# Patient Record
Sex: Female | Born: 1980 | Race: White | Hispanic: Yes | Marital: Single | Smoking: Never smoker
Health system: Southern US, Community
[De-identification: ages and names within clinical notes are randomized; demographics above are authoritative.]

## PROBLEM LIST (undated history)

## (undated) DIAGNOSIS — O24419 Gestational diabetes mellitus in pregnancy, unspecified control: Secondary | ICD-10-CM

## (undated) DIAGNOSIS — R87619 Unspecified abnormal cytological findings in specimens from cervix uteri: Secondary | ICD-10-CM

## (undated) HISTORY — PX: DILATION AND CURETTAGE OF UTERUS: SHX78

## (undated) HISTORY — DX: Gestational diabetes mellitus in pregnancy, unspecified control: O24.419

---

## 2004-11-04 ENCOUNTER — Ambulatory Visit (HOSPITAL_COMMUNITY): Admission: AD | Admit: 2004-11-04 | Discharge: 2004-11-04 | Payer: Self-pay | Admitting: Obstetrics & Gynecology

## 2005-06-02 ENCOUNTER — Other Ambulatory Visit: Admission: RE | Admit: 2005-06-02 | Discharge: 2005-06-02 | Payer: Self-pay | Admitting: Family Medicine

## 2006-03-25 ENCOUNTER — Inpatient Hospital Stay (HOSPITAL_COMMUNITY): Admission: AD | Admit: 2006-03-25 | Discharge: 2006-03-25 | Payer: Self-pay | Admitting: Obstetrics & Gynecology

## 2006-05-08 ENCOUNTER — Inpatient Hospital Stay (HOSPITAL_COMMUNITY): Admission: AD | Admit: 2006-05-08 | Discharge: 2006-05-08 | Payer: Self-pay | Admitting: Obstetrics and Gynecology

## 2006-05-09 ENCOUNTER — Inpatient Hospital Stay (HOSPITAL_COMMUNITY): Admission: AD | Admit: 2006-05-09 | Discharge: 2006-05-13 | Payer: Self-pay | Admitting: Obstetrics and Gynecology

## 2007-01-11 IMAGING — US US OB COMP LESS 14 WK
1 series · 14 of 28 positions shown · non-contrast
Comparison: none

CLINICAL DATA: EGA by LMP is 21 weeks; nausea and spotting; quantitative BETA HCG was requested but is not available.

[Series 1: us ob comp less 14 wk · 0.29mm/px · 14 of 36 slices shown]
[im 2/36]
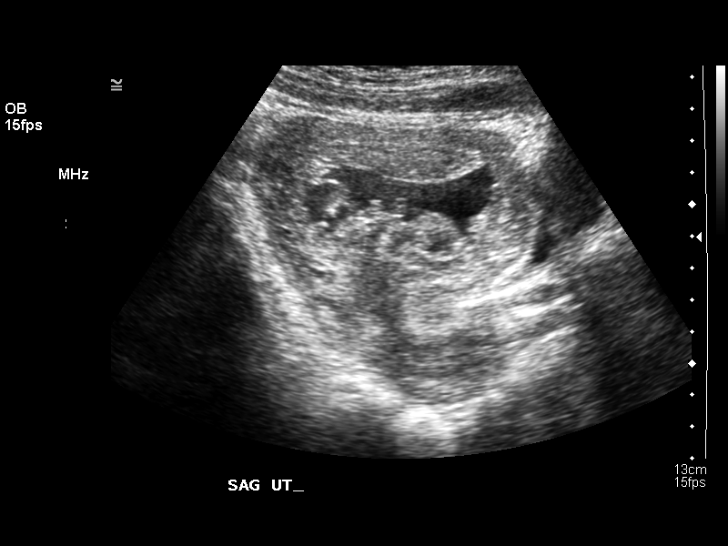
[im 4/36]
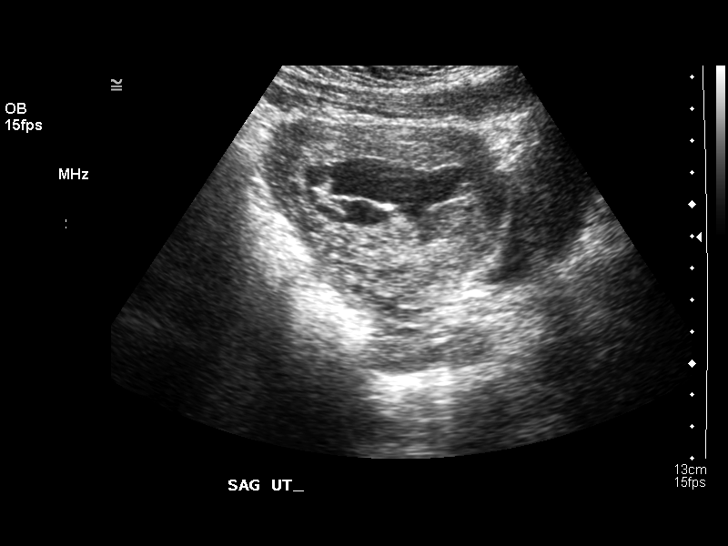
[im 7/36]
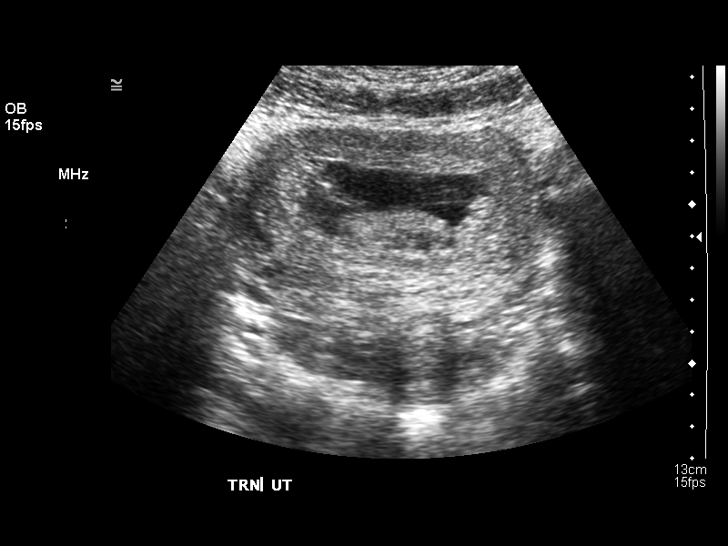
[im 10/36]
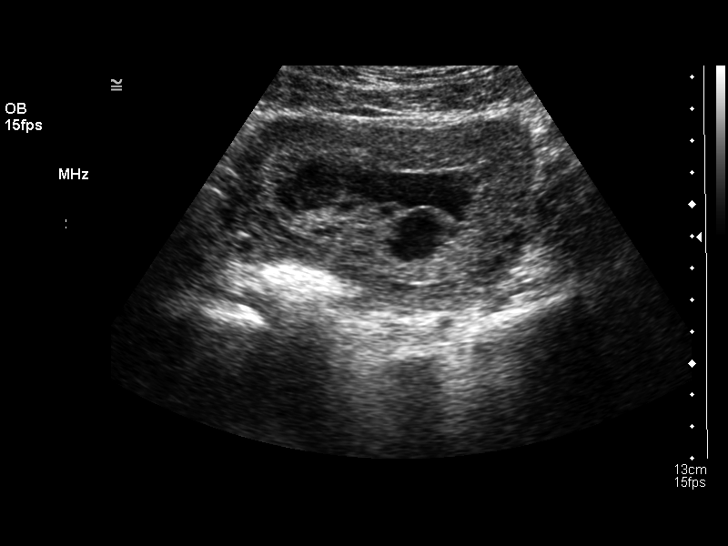
[im 12/36]
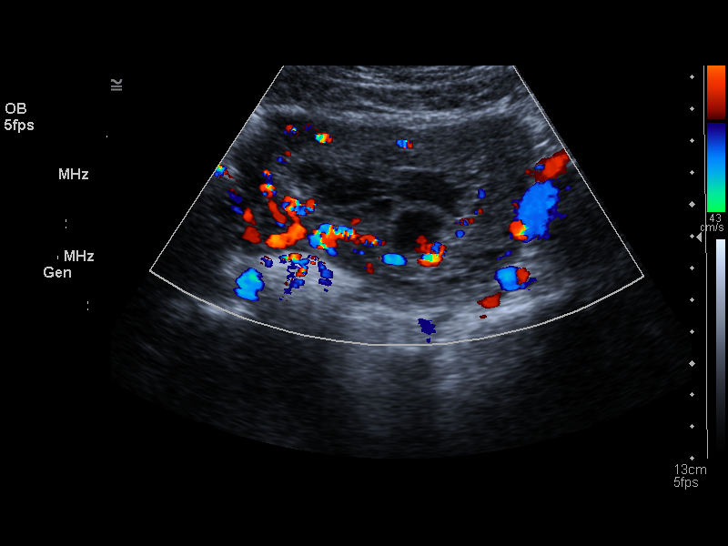
[im 15/36]
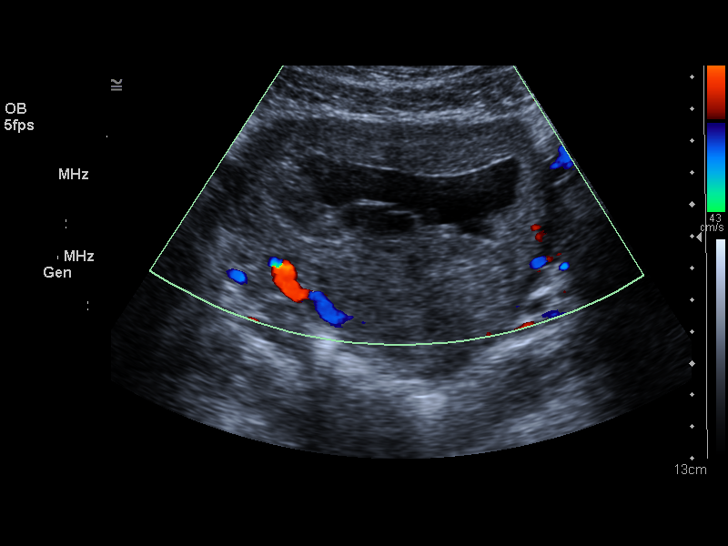
[im 17/36]
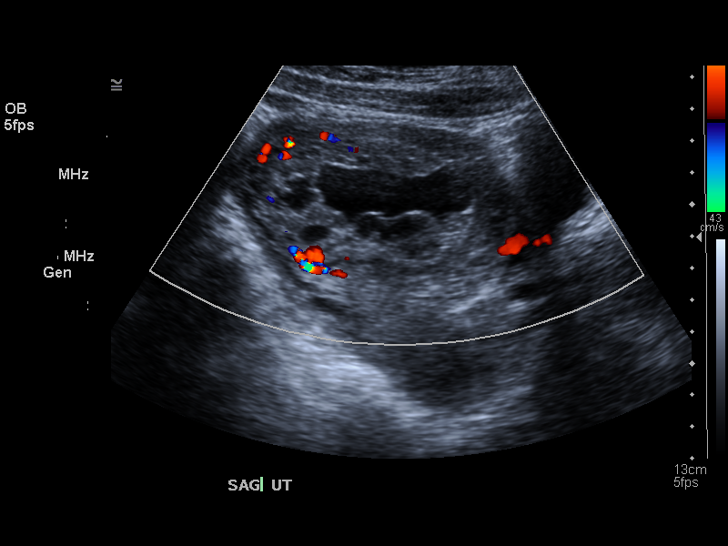
[im 20/36]
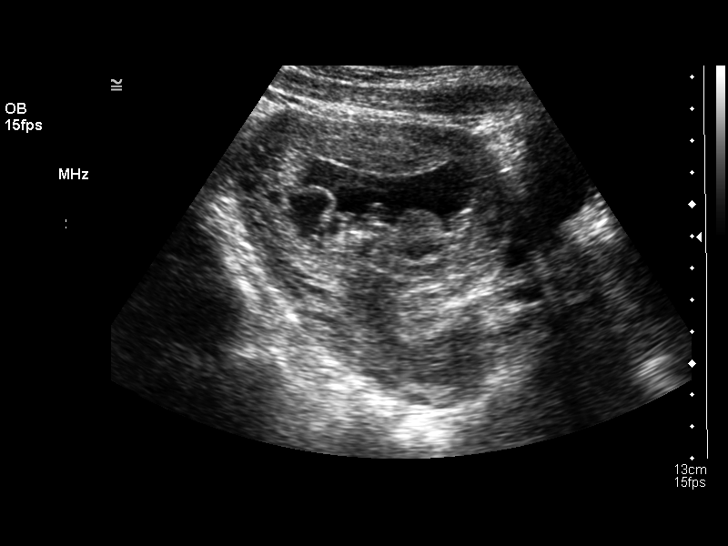
[im 23/36]
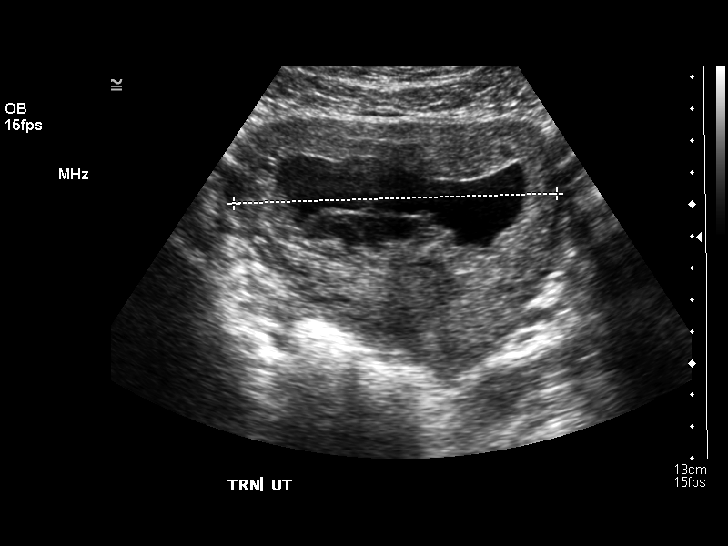
[im 25/36]
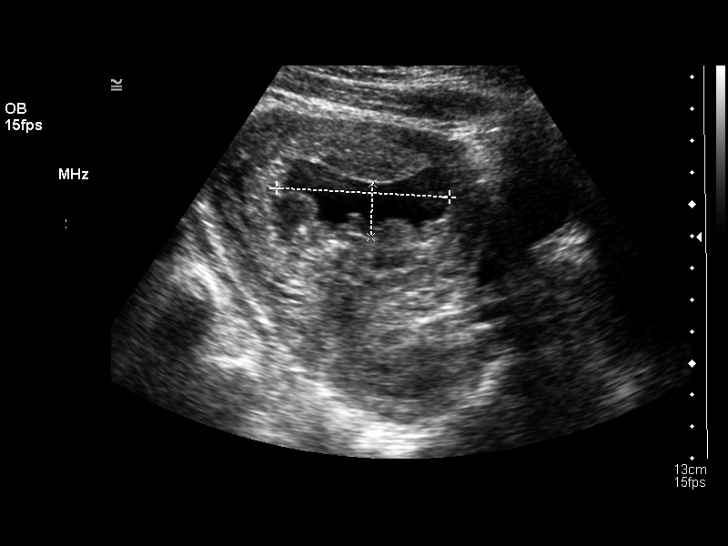
[im 28/36]
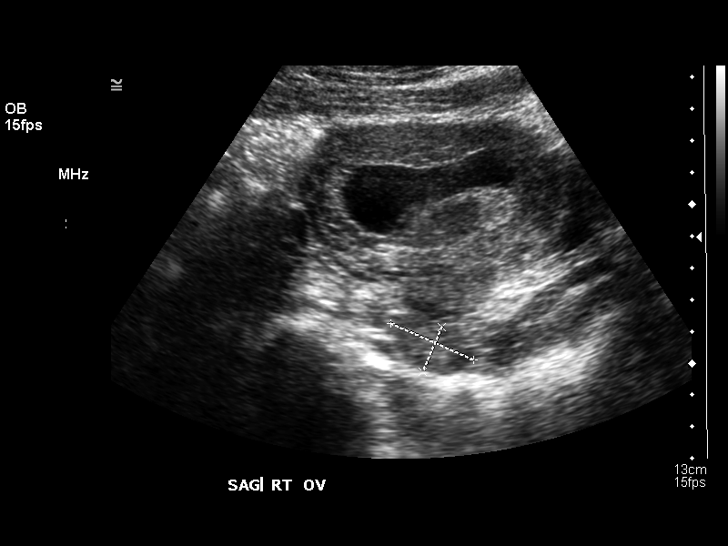
[im 30/36]
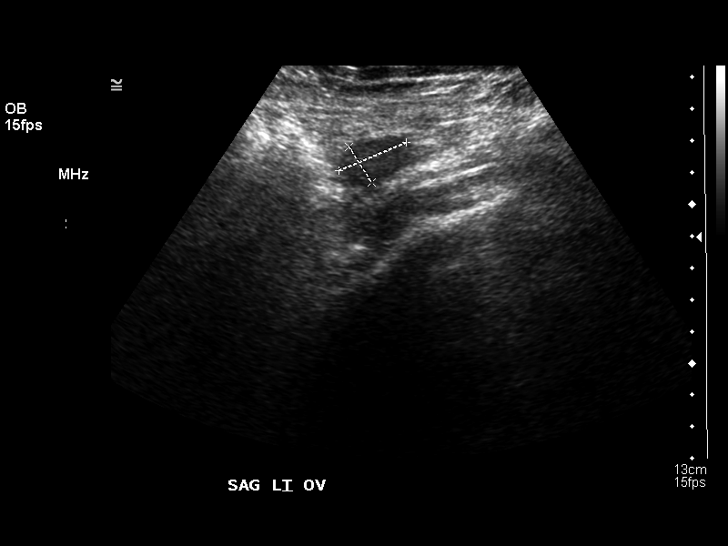
[im 33/36]
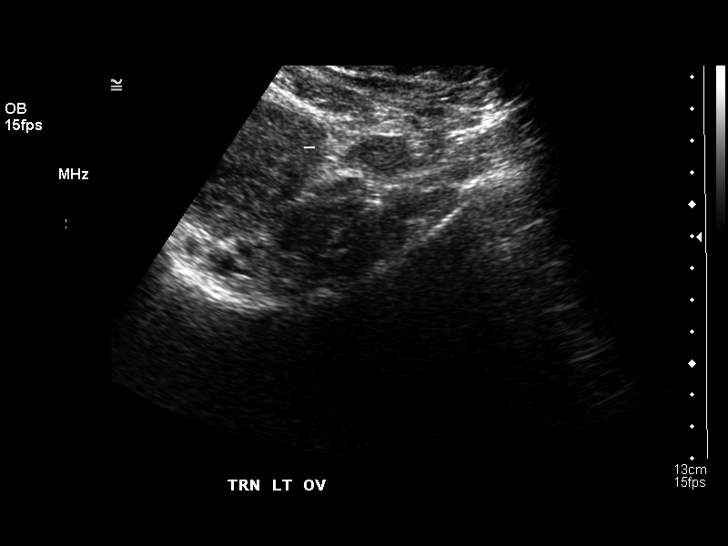
[im 36/36]
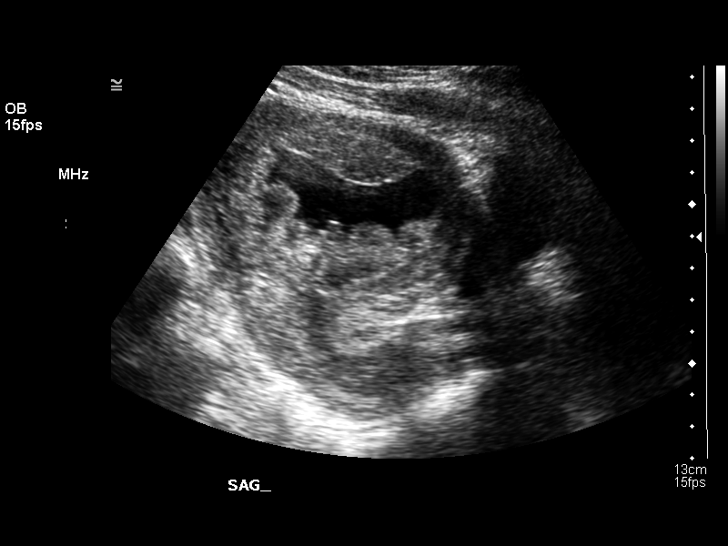

[14 of 28 positions shown; findings below may reference images not displayed]

OBSTETRICAL ULTRASOUND:

Transabdominal and endovaginal images are obtained.  There is abnormal mixed solid and cystic material within the endometrial cavity.   The uterus itself is enlarged measuring 11.4 x 8.7 x 10.2 cm.  There is no obvious fetus within the endometrial cavity.  The intrauterine gestational sac is very irregular and difficult to accurately measure.   Both ovaries have a normal appearance.  The right ovary measures 1.5 x 2.8 x 1.5 cm, and the left ovary measures 2.3 x 1.3 x 2.1 cm.   No adnexal masses or free pelvic fluid are seen.
IMPRESSION: 1.  The mixed solid and cystic material within the uterus most likely represents resorbing fetal demise.   Please correlate with the quantitative BETA HCG values.  

2.  Both adnexa have a normal appearance.

## 2010-05-31 ENCOUNTER — Other Ambulatory Visit: Payer: Self-pay | Admitting: Obstetrics and Gynecology

## 2012-07-31 ENCOUNTER — Other Ambulatory Visit: Payer: Self-pay | Admitting: Obstetrics and Gynecology

## 2014-10-23 LAB — OB RESULTS CONSOLE GC/CHLAMYDIA
CHLAMYDIA, DNA PROBE: NEGATIVE
GC PROBE AMP, GENITAL: NEGATIVE

## 2014-12-04 LAB — OB RESULTS CONSOLE RPR: RPR: NONREACTIVE

## 2014-12-04 LAB — OB RESULTS CONSOLE RUBELLA ANTIBODY, IGM: Rubella: IMMUNE

## 2014-12-04 LAB — OB RESULTS CONSOLE HGB/HCT, BLOOD
HEMATOCRIT: 37 %
HEMOGLOBIN: 12.9 g/dL

## 2014-12-04 LAB — OB RESULTS CONSOLE PLATELET COUNT: Platelets: 222 10*3/uL

## 2014-12-04 LAB — OB RESULTS CONSOLE ANTIBODY SCREEN: ANTIBODY SCREEN: NEGATIVE

## 2014-12-04 LAB — OB RESULTS CONSOLE HIV ANTIBODY (ROUTINE TESTING): HIV: NONREACTIVE

## 2014-12-04 LAB — OB RESULTS CONSOLE VARICELLA ZOSTER ANTIBODY, IGG: Varicella: IMMUNE

## 2014-12-04 LAB — OB RESULTS CONSOLE HEPATITIS B SURFACE ANTIGEN: Hepatitis B Surface Ag: NEGATIVE

## 2014-12-04 LAB — OB RESULTS CONSOLE ABO/RH: RH Type: POSITIVE

## 2015-03-04 LAB — OB RESULTS CONSOLE HGB/HCT, BLOOD
HCT: 31 %
HCT: 31 %
HEMOGLOBIN: 11.2 g/dL
HEMOGLOBIN: 11.2 g/dL

## 2015-03-04 LAB — OB RESULTS CONSOLE RPR
RPR: NONREACTIVE
RPR: NONREACTIVE

## 2015-03-04 LAB — OB RESULTS CONSOLE PLATELET COUNT
PLATELETS: 215 10*3/uL
Platelets: 215 10*3/uL

## 2015-03-23 ENCOUNTER — Encounter: Payer: Self-pay | Admitting: *Deleted

## 2015-03-23 DIAGNOSIS — O0990 Supervision of high risk pregnancy, unspecified, unspecified trimester: Secondary | ICD-10-CM

## 2015-03-23 DIAGNOSIS — O24419 Gestational diabetes mellitus in pregnancy, unspecified control: Secondary | ICD-10-CM | POA: Insufficient documentation

## 2015-04-13 ENCOUNTER — Encounter: Payer: Self-pay | Admitting: Family Medicine

## 2015-04-13 ENCOUNTER — Ambulatory Visit (INDEPENDENT_AMBULATORY_CARE_PROVIDER_SITE_OTHER): Payer: Self-pay | Admitting: Family Medicine

## 2015-04-13 ENCOUNTER — Encounter: Payer: Self-pay | Admitting: *Deleted

## 2015-04-13 ENCOUNTER — Encounter: Payer: Medicaid Other | Attending: Obstetrics and Gynecology | Admitting: *Deleted

## 2015-04-13 VITALS — BP 103/65 | HR 96 | Temp 98.0°F | Ht 59.0 in | Wt 138.0 lb

## 2015-04-13 DIAGNOSIS — Z23 Encounter for immunization: Secondary | ICD-10-CM

## 2015-04-13 DIAGNOSIS — Z713 Dietary counseling and surveillance: Secondary | ICD-10-CM | POA: Insufficient documentation

## 2015-04-13 DIAGNOSIS — Z791 Long term (current) use of non-steroidal anti-inflammatories (NSAID): Secondary | ICD-10-CM | POA: Diagnosis not present

## 2015-04-13 DIAGNOSIS — Z98891 History of uterine scar from previous surgery: Secondary | ICD-10-CM | POA: Insufficient documentation

## 2015-04-13 DIAGNOSIS — O24419 Gestational diabetes mellitus in pregnancy, unspecified control: Secondary | ICD-10-CM | POA: Insufficient documentation

## 2015-04-13 DIAGNOSIS — O0993 Supervision of high risk pregnancy, unspecified, third trimester: Secondary | ICD-10-CM

## 2015-04-13 DIAGNOSIS — O34219 Maternal care for unspecified type scar from previous cesarean delivery: Secondary | ICD-10-CM

## 2015-04-13 MED ORDER — GLUCOSE BLOOD VI STRP: ORAL_STRIP | Status: DC

## 2015-04-13 MED ORDER — ACCU-CHEK FASTCLIX LANCETS MISC: 1.0000 | Freq: Four times a day (QID) | Status: DC

## 2015-04-13 MED ORDER — TETANUS-DIPHTH-ACELL PERTUSSIS 5-2.5-18.5 LF-MCG/0.5 IM SUSP
0.5000 mL | Freq: Once | INTRAMUSCULAR | Status: AC
  Administered 2015-04-13: 0.5 mL via INTRAMUSCULAR

## 2015-04-13 NOTE — Progress Notes (Signed)
Growth U/S with heart imaging 04/23/15 @ 3p with MFM.

## 2015-04-13 NOTE — Patient Instructions (Signed)

## 2015-04-13 NOTE — Addendum Note (Signed)
Addended by: Rosendo GrosHALPIN, Laird Runnion L on: 04/13/2015 01:55 PM   Modules accepted: Orders

## 2015-04-13 NOTE — Addendum Note (Signed)
Addended by: Gerome ApleyZEYFANG, Memorie Yokoyama L on: 04/13/2015 10:09 AM   Modules accepted: Orders

## 2015-04-13 NOTE — Progress Notes (Signed)
Nutrition note: 1st visit consult & GDM diet education Pt was recently diagnosed with GDM. Pt has gained 28# @ 7953w2d, which is slightly > expected. Pt reports eating 3-4x/d. Pt is taking a multivitamin. Pt reports no N&V but has some heartburn. Pt received verbal & written education about GDM diet. Discussed wt gain goals of 25-35# or 1#/wk. Pt agrees to follow GDM diet with 3 meals & 1-3 snacks/d with proper CHO/ protein combination. Pt does not have WIC but plans to apply. Pt is unsure about BF. F/u in 2-4 wks Beverly RevealLaura Foch Rosenwald, MS, RD, LDN, [facility]

## 2015-04-13 NOTE — Addendum Note (Signed)
Addended by: Garret ReddishBARNES, Keni Elison M on: 04/13/2015 04:06 PM   Modules accepted: Orders

## 2015-04-13 NOTE — Assessment & Plan Note (Signed)
VBAC consent signed 04/13/2015  VBAC success ~40%

## 2015-04-13 NOTE — Progress Notes (Signed)
Here for first prenatal visit. [facility] Declines flu shot today; may get it later. Would like tdap today.

## 2015-04-13 NOTE — Progress Notes (Signed)
Subjective:  Beverly Mayer is a 34 y.o. G3P1101 at 3264w2d being seen today for ongoing prenatal care.  She is currently monitored for the following issues for this high-risk pregnancy and has Supervision of high risk pregnancy, antepartum; Gestational diabetes; and Uterine scar from previous cesarean delivery, antepartum on her problem list.  Patient reports no complaints.  Contractions: Not present. Vag. Bleeding: None.  Movement: Present. Denies leaking of fluid.   No checking her sugar currently and has not this whole pregnancy. Made diet changes. Stopped eating sugar.   VBAC success- 40%  Takign advil 2-3 times per week for the last month.   The following portions of the patient's history were reviewed and updated as appropriate: allergies, current medications, past family history, past medical history, past social history, past surgical history and problem list. Problem list updated.  Objective:   Filed Vitals:   04/13/15 0812 04/13/15 0813  BP: 103/65   Pulse: 96   Temp: 98 F (36.7 C)   Height:  4\' 11"  (1.499 m)  Weight: 138 lb (62.596 kg)     Fetal Status: Fetal Heart Rate (bpm): 136   Movement: Present     General:  Alert, oriented and cooperative. Patient is in no acute distress.  Skin: Skin is warm and dry. No rash noted.   Cardiovascular: Normal heart rate noted  Respiratory: Normal respiratory effort, no problems with respiration noted  Abdomen: Soft, gravid, appropriate for gestational age. Pain/Pressure: Present     Pelvic: Vag. Bleeding: None     Cervical exam deferred        Extremities: Normal range of motion.  Edema: None  Mental Status: Normal mood and affect. Normal behavior. Normal judgment and thought content.   Urinalysis:      Assessment and Plan:  Pregnancy: G3P1101 at 6264w2d  1. Supervision of high risk pregnancy, antepartum, third trimester [facility]  2. Gestational diabetes mellitus, unspecified diabetic control, unspecified  trimester Not checking sugars at all. Has made diet changes Meeting with DM ed today Will have patient check sugars QID  3. Uterine scar from previous cesarean delivery, antepartum [place] consent signed US ordered for EFW  Preterm labor symptoms and general obstetric precautions including but not limited to vaginal bleeding, contractions, leaking of fluid and fetal movement were reviewed in detail with the patient. Please refer to After Visit Summary for other counseling recommendations.  Return in about 2 weeks (around 04/27/2015) for Routine prenatal care.   Federico FlakeKimberly Niles Autumn Gunn, MD

## 2015-04-13 NOTE — Progress Notes (Signed)
Did not give urine specimen.

## 2015-04-13 NOTE — Progress Notes (Signed)
  Patient was seen on 04/13/15 for Gestational Diabetes self-management . The following learning objectives were met by the patient :   States the definition of Gestational Diabetes  States when to check blood glucose levels  Demonstrates proper blood glucose monitoring techniques  States the effect of stress and exercise on blood glucose levels  States the importance of limiting caffeine and abstaining from alcohol and smoking  Plan:  Consider  increasing your activity level by walking daily as tolerated Begin checking BG before breakfast and 1-2 hours after first bit of breakfast, lunch and dinner after  as directed by MD  Take medication  as directed by MD  Blood glucose monitor given: Accu-Chek Nano Lot # [id number] Exp: 2016/01/23 Blood glucose reading: FBS 78m/dl  Patient instructed to monitor glucose levels: FBS: 60 - <90 1 hour: <140 2 hour: <120  Patient received the following handouts:  Nutrition Diabetes and Pregnancy  Carbohydrate Counting List  Meal Planning worksheet  Patient will be seen for follow-up as needed.

## 2015-04-23 ENCOUNTER — Other Ambulatory Visit: Payer: Self-pay | Admitting: Family Medicine

## 2015-04-23 ENCOUNTER — Ambulatory Visit (HOSPITAL_COMMUNITY)
Admission: RE | Admit: 2015-04-23 | Discharge: 2015-04-23 | Disposition: A | Discharge status: Home / Self Care | Payer: Medicaid Other | Source: Ambulatory Visit | Attending: Family Medicine | Admitting: Family Medicine

## 2015-04-23 DIAGNOSIS — O24419 Gestational diabetes mellitus in pregnancy, unspecified control: Secondary | ICD-10-CM

## 2015-04-23 DIAGNOSIS — O34219 Maternal care for unspecified type scar from previous cesarean delivery: Secondary | ICD-10-CM | POA: Insufficient documentation

## 2015-04-23 DIAGNOSIS — O24410 Gestational diabetes mellitus in pregnancy, diet controlled: Secondary | ICD-10-CM | POA: Insufficient documentation

## 2015-04-23 DIAGNOSIS — Z3A34 34 weeks gestation of pregnancy: Secondary | ICD-10-CM

## 2015-04-23 DIAGNOSIS — Z36 Encounter for antenatal screening of mother: Secondary | ICD-10-CM | POA: Diagnosis not present

## 2015-04-23 DIAGNOSIS — Z1389 Encounter for screening for other disorder: Secondary | ICD-10-CM

## 2015-04-24 ENCOUNTER — Other Ambulatory Visit: Payer: Self-pay | Admitting: Family Medicine

## 2015-04-24 DIAGNOSIS — Z1389 Encounter for screening for other disorder: Secondary | ICD-10-CM

## 2015-04-24 DIAGNOSIS — O34219 Maternal care for unspecified type scar from previous cesarean delivery: Secondary | ICD-10-CM

## 2015-04-24 DIAGNOSIS — O24419 Gestational diabetes mellitus in pregnancy, unspecified control: Secondary | ICD-10-CM

## 2015-04-24 DIAGNOSIS — Z3A34 34 weeks gestation of pregnancy: Secondary | ICD-10-CM

## 2015-04-27 ENCOUNTER — Encounter: Payer: Medicaid Other | Admitting: Family Medicine

## 2015-05-04 ENCOUNTER — Encounter: Payer: Medicaid Other | Admitting: Obstetrics & Gynecology

## 2015-05-11 ENCOUNTER — Other Ambulatory Visit (HOSPITAL_COMMUNITY)
Admission: RE | Admit: 2015-05-11 | Discharge: 2015-05-11 | Disposition: A | Discharge status: Home / Self Care | Payer: Medicaid Other | Source: Ambulatory Visit | Attending: Obstetrics & Gynecology | Admitting: Obstetrics & Gynecology

## 2015-05-11 ENCOUNTER — Encounter: Payer: Self-pay | Admitting: *Deleted

## 2015-05-11 ENCOUNTER — Ambulatory Visit (INDEPENDENT_AMBULATORY_CARE_PROVIDER_SITE_OTHER): Payer: Medicaid Other | Admitting: Obstetrics & Gynecology

## 2015-05-11 ENCOUNTER — Encounter: Payer: Medicaid Other | Attending: Obstetrics and Gynecology | Admitting: *Deleted

## 2015-05-11 ENCOUNTER — Encounter: Payer: Self-pay | Admitting: Obstetrics & Gynecology

## 2015-05-11 VITALS — BP 111/70 | HR 80 | Temp 98.2°F | Wt 143.0 lb

## 2015-05-11 DIAGNOSIS — Z113 Encounter for screening for infections with a predominantly sexual mode of transmission: Secondary | ICD-10-CM | POA: Diagnosis not present

## 2015-05-11 DIAGNOSIS — Z713 Dietary counseling and surveillance: Secondary | ICD-10-CM | POA: Diagnosis not present

## 2015-05-11 DIAGNOSIS — O24419 Gestational diabetes mellitus in pregnancy, unspecified control: Secondary | ICD-10-CM

## 2015-05-11 LAB — POCT URINALYSIS DIP (DEVICE)
BILIRUBIN URINE: NEGATIVE
GLUCOSE, UA: NEGATIVE mg/dL
Hgb urine dipstick: NEGATIVE
LEUKOCYTES UA: NEGATIVE
NITRITE: NEGATIVE
PH: 6.5 (ref 5.0–8.0)
Protein, ur: NEGATIVE mg/dL
Specific Gravity, Urine: 1.02 (ref 1.005–1.030)
Urobilinogen, UA: 0.2 mg/dL (ref 0.0–1.0)

## 2015-05-11 NOTE — Patient Instructions (Signed)
Cesarean Delivery, Care After  Refer to this sheet in the next few weeks. These instructions provide you with information on caring for yourself after your procedure. Your health care provider may also give you specific instructions. Your treatment has been planned according to current medical practices, but problems sometimes occur. Call your health care provider if you have any problems or questions after you go home.  HOME CARE INSTRUCTIONS   Only take over-the-counter or prescription medications as directed by your health care provider.   Do not drink alcohol, especially if you are breastfeeding or taking medication to relieve pain.   Do not chew or smoke tobacco.   Continue to use good perineal care. Good perineal care includes:    Wiping your perineum from front to back.    Keeping your perineum clean.   Check your surgical cut (incision) daily for increased redness, drainage, swelling, or separation of skin.   Clean your incision gently with soap and water every day, and then pat it dry. If your health care provider says it is okay, leave the incision uncovered. Use a bandage (dressing) if the incision is draining fluid or appears irritated. If the adhesive strips across the incision do not fall off within 7 days, carefully peel them off.   Hug a pillow when coughing or sneezing until your incision is healed. This helps to relieve pain.   Do not use tampons or douche until your health care provider says it is okay.   Shower, wash your hair, and take tub baths as directed by your health care provider.   Wear a well-fitting bra that provides breast support.   Limit wearing support panties or control-top hose.   Drink enough fluids to keep your urine clear or pale yellow.   Eat high-fiber foods such as whole grain cereals and breads, brown rice, beans, and fresh fruits and vegetables every day. These foods may help prevent or relieve constipation.   Resume activities such as climbing stairs,  driving, lifting, exercising, or traveling as directed by your health care provider.   Talk to your health care provider about resuming sexual activities. This is dependent upon your risk of infection, your rate of healing, and your comfort and desire to resume sexual activity.   Try to have someone help you with your household activities and your newborn for at least a few days after you leave the hospital.   Rest as much as possible. Try to rest or take a nap when your newborn is sleeping.   Increase your activities gradually.   Keep all of your scheduled postpartum appointments. It is very important to keep your scheduled follow-up appointments. At these appointments, your health care provider will be checking to make sure that you are healing physically and emotionally.  SEEK MEDICAL CARE IF:    You are passing large clots from your vagina. Save any clots to show your health care provider.   You have a foul smelling discharge from your vagina.   You have trouble urinating.   You are urinating frequently.   You have pain when you urinate.   You have a change in your bowel movements.   You have increasing redness, pain, or swelling near your incision.   You have pus draining from your incision.   Your incision is separating.   You have painful, hard, or reddened breasts.   You have a severe headache.   You have blurred vision or see spots.   You feel sad   or depressed.   You have thoughts of hurting yourself or your newborn.   You have questions about your care, the care of your newborn, or medications.   You are dizzy or light-headed.   You have a rash.   You have pain, redness, or swelling at the site of the removed intravenous access (IV) tube.   You have nausea or vomiting.   You stopped breastfeeding and have not had a menstrual period within 12 weeks of stopping.   You are not breastfeeding and have not had a menstrual period within 12 weeks of delivery.   You have a fever.  SEEK  IMMEDIATE MEDICAL CARE IF:   You have persistent pain.   You have chest pain.   You have shortness of breath.   You faint.   You have leg pain.   You have stomach pain.   Your vaginal bleeding saturates 2 or more sanitary pads in 1 hour.  MAKE SURE YOU:    Understand these instructions.   Will watch your condition.   Will get help right away if you are not doing well or get worse.     This information is not intended to replace advice given to you by your health care provider. Make sure you discuss any questions you have with your health care provider.     Document Released: 01/01/2002 Document Revised: 05/02/2014 Document Reviewed: 12/07/2011  Elsevier Interactive Patient Education 2016 [employer]

## 2015-05-11 NOTE — Progress Notes (Signed)
36 wk cultures today Pt reports pressure in vaginal area

## 2015-05-11 NOTE — Progress Notes (Signed)
Patient presents stating that her [facility] stated that her Medicaid would not cover her test strips. She made no effort to contact the clinic for resolution. Therefore she has not tested at all this month. I contacted [facility] and provided new RX for different testing supplies. I stressed the importance of testing regularly and managing her glucose effectively for the welfare of her and the baby.  Dispensed: Accu-Check [employer] Connect Lot: [id number] Exp 09/23/15 Glucose WNL

## 2015-05-11 NOTE — Progress Notes (Signed)
Subjective:hasn't had test strips for 3 weeks  Beverly Mayer is a 35 y.o. G3P1101 at 8726w2d being seen today for ongoing prenatal care.  She is currently monitored for the following issues for this high-risk pregnancy and has Supervision of high risk pregnancy, antepartum; Gestational diabetes; Uterine scar from previous cesarean delivery, antepartum; and NSAID in pregnancy, third trimester on her problem list.  Patient reports no complaints.  Contractions: Not present. Vag. Bleeding: None.  Movement: Present. Denies leaking of fluid.   The following portions of the patient's history were reviewed and updated as appropriate: allergies, current medications, past family history, past medical history, past social history, past surgical history and problem list. Problem list updated.  Objective:   Filed Vitals:   05/11/15 1106  BP: 111/70  Pulse: 80  Temp: 98.2 F (36.8 C)  Weight: 143 lb (64.864 kg)    Fetal Status:     Movement: Present     General:  Alert, oriented and cooperative. Patient is in no acute distress.  Skin: Skin is warm and dry. No rash noted.   Cardiovascular: Normal heart rate noted  Respiratory: Normal respiratory effort, no problems with respiration noted  Abdomen: Soft, gravid, appropriate for gestational age. Pain/Pressure: Present     Pelvic: Vag. Bleeding: None     Cervical exam deferred        Extremities: Normal range of motion.  Edema: None  Mental Status: Normal mood and affect. Normal behavior. Normal judgment and thought content.   Urinalysis:      Assessment and Plan:  Pregnancy: G3P1101 at 6526w2d  1. Gestational diabetes mellitus in third trimester, unspecified diabetic control No testing to review - Culture, beta strep (group b only) - [facility]Chlamydia [facility]  Term labor symptoms and general obstetric precautions including but not limited to vaginal bleeding, contractions, leaking of fluid and fetal movement were  reviewed in detail with the patient. Please refer to After Visit Summary for other counseling recommendations.  Return in about 1 week (around 05/18/2015). Repeat CS and BTL requested at 39 weeks 05/23/15  Adam PhenixJames G Dionisia Pacholski, MD

## 2015-05-12 ENCOUNTER — Encounter (HOSPITAL_COMMUNITY): Payer: Self-pay | Admitting: *Deleted

## 2015-05-12 LAB — GC/CHLAMYDIA PROBE AMP (~~LOC~~) NOT AT ARMC
Chlamydia: NEGATIVE
Neisseria Gonorrhea: NEGATIVE

## 2015-05-13 LAB — CULTURE, BETA STREP (GROUP B ONLY)

## 2015-05-14 ENCOUNTER — Encounter (HOSPITAL_COMMUNITY): Payer: Self-pay | Admitting: Obstetrics & Gynecology

## 2015-05-14 ENCOUNTER — Encounter: Payer: Self-pay | Admitting: *Deleted

## 2015-05-18 ENCOUNTER — Ambulatory Visit (INDEPENDENT_AMBULATORY_CARE_PROVIDER_SITE_OTHER): Payer: Medicaid Other | Admitting: Family Medicine

## 2015-05-18 VITALS — BP 114/77 | HR 96 | Temp 98.3°F | Wt 149.0 lb

## 2015-05-18 DIAGNOSIS — O24419 Gestational diabetes mellitus in pregnancy, unspecified control: Secondary | ICD-10-CM

## 2015-05-18 DIAGNOSIS — O34219 Maternal care for unspecified type scar from previous cesarean delivery: Secondary | ICD-10-CM

## 2015-05-18 DIAGNOSIS — O0993 Supervision of high risk pregnancy, unspecified, third trimester: Secondary | ICD-10-CM

## 2015-05-18 NOTE — Progress Notes (Signed)
Reviewed tip of week with patient  

## 2015-05-18 NOTE — Patient Instructions (Signed)
Contraception Choices Contraception (birth control) is the use of any methods or devices to prevent pregnancy. Below are some methods to help avoid pregnancy. HORMONAL METHODS   Contraceptive implant. This is a thin, plastic tube containing progesterone hormone. It does not contain estrogen hormone. Your health care provider inserts the tube in the inner part of the upper arm. The tube can remain in place for up to 3 years. After 3 years, the implant must be removed. The implant prevents the ovaries from releasing an egg (ovulation), thickens the cervical mucus to prevent sperm from entering the uterus, and thins the lining of the inside of the uterus.  Progesterone-only injections. These injections are given every 3 months by your health care provider to prevent pregnancy. This synthetic progesterone hormone stops the ovaries from releasing eggs. It also thickens cervical mucus and changes the uterine lining. This makes it harder for sperm to survive in the uterus.  Birth control pills. These pills contain estrogen and progesterone hormone. They work by preventing the ovaries from releasing eggs (ovulation). They also cause the cervical mucus to thicken, preventing the sperm from entering the uterus. Birth control pills are prescribed by a health care provider.Birth control pills can also be used to treat heavy periods.  Minipill. This type of birth control pill contains only the progesterone hormone. They are taken every day of each month and must be prescribed by your health care provider.  Birth control patch. The patch contains hormones similar to those in birth control pills. It must be changed once a week and is prescribed by a health care provider.  Vaginal ring. The ring contains hormones similar to those in birth control pills. It is left in the vagina for 3 weeks, removed for 1 week, and then a new one is put back in place. The patient must be comfortable inserting and removing the ring  from the vagina.A health care provider's prescription is necessary.  Emergency contraception. Emergency contraceptives prevent pregnancy after unprotected sexual intercourse. This pill can be taken right after sex or up to 5 days after unprotected sex. It is most effective the sooner you take the pills after having sexual intercourse. Most emergency contraceptive pills are available without a prescription. Check with your pharmacist. Do not use emergency contraception as your only form of birth control. BARRIER METHODS   Female condom. This is a thin sheath (latex or rubber) that is worn over the penis during sexual intercourse. It can be used with spermicide to increase effectiveness.  Female condom. This is a soft, loose-fitting sheath that is put into the vagina before sexual intercourse.  Diaphragm. This is a soft, latex, dome-shaped barrier that must be fitted by a health care provider. It is inserted into the vagina, along with a spermicidal jelly. It is inserted before intercourse. The diaphragm should be left in the vagina for 6 to 8 hours after intercourse.  Cervical cap. This is a round, soft, latex or plastic cup that fits over the cervix and must be fitted by a health care provider. The cap can be left in place for up to 48 hours after intercourse.  Sponge. This is a soft, circular piece of polyurethane foam. The sponge has spermicide in it. It is inserted into the vagina after wetting it and before sexual intercourse.  Spermicides. These are chemicals that kill or block sperm from entering the cervix and uterus. They come in the form of creams, jellies, suppositories, foam, or tablets. They do not require a   prescription. They are inserted into the vagina with an applicator before having sexual intercourse. The process must be repeated every time you have sexual intercourse. INTRAUTERINE CONTRACEPTION  Intrauterine device (IUD). This is a T-shaped device that is put in a woman's uterus  during a menstrual period to prevent pregnancy. There are 2 types:  Copper IUD. This type of IUD is wrapped in copper wire and is placed inside the uterus. Copper makes the uterus and fallopian tubes produce a fluid that kills sperm. It can stay in place for 10 years.  Hormone IUD. This type of IUD contains the hormone progestin (synthetic progesterone). The hormone thickens the cervical mucus and prevents sperm from entering the uterus, and it also thins the uterine lining to prevent implantation of a fertilized egg. The hormone can weaken or kill the sperm that get into the uterus. It can stay in place for 3-5 years, depending on which type of IUD is used. PERMANENT METHODS OF CONTRACEPTION  Female tubal ligation. This is when the woman's fallopian tubes are surgically sealed, tied, or blocked to prevent the egg from traveling to the uterus.  Hysteroscopic sterilization. This involves placing a small coil or insert into each fallopian tube. Your doctor uses a technique called hysteroscopy to do the procedure. The device causes scar tissue to form. This results in permanent blockage of the fallopian tubes, so the sperm cannot fertilize the egg. It takes about 3 months after the procedure for the tubes to become blocked. You must use another form of birth control for these 3 months.  Female sterilization. This is when the female has the tubes that carry sperm tied off (vasectomy).This blocks sperm from entering the vagina during sexual intercourse. After the procedure, the man can still ejaculate fluid (semen). NATURAL PLANNING METHODS  Natural family planning. This is not having sexual intercourse or using a barrier method (condom, diaphragm, cervical cap) on days the woman could become pregnant.  Calendar method. This is keeping track of the length of each menstrual cycle and identifying when you are fertile.  Ovulation method. This is avoiding sexual intercourse during ovulation.  Symptothermal  method. This is avoiding sexual intercourse during ovulation, using a thermometer and ovulation symptoms.  Post-ovulation method. This is timing sexual intercourse after you have ovulated. Regardless of which type or method of contraception you choose, it is important that you use condoms to protect against the transmission of sexually transmitted infections (STIs). Talk with your health care provider about which form of contraception is most appropriate for you.   This information is not intended to replace advice given to you by your health care provider. Make sure you discuss any questions you have with your health care provider.   Document Released: 04/11/2005 Document Revised: 04/16/2013 Document Reviewed: 10/04/2012 Elsevier Interactive Patient Education 2016 [employer]  Breastfeeding Deciding to breastfeed is one of the best choices you can make for you and your baby. A change in hormones during pregnancy causes your breast tissue to grow and increases the number and size of your milk ducts. These hormones also allow proteins, sugars, and fats from your blood supply to make breast milk in your milk-producing glands. Hormones prevent breast milk from being released before your baby is born as well as prompt milk flow after birth. Once breastfeeding has begun, thoughts of your baby, as well as his or her sucking or crying, can stimulate the release of milk from your milk-producing glands.  BENEFITS OF BREASTFEEDING For Your Baby    Your first milk (colostrum) helps your baby's digestive system function better.  There are antibodies in your milk that help your baby fight off infections.  Your baby has a lower incidence of asthma, allergies, and sudden infant death syndrome.  The nutrients in breast milk are better for your baby than infant formulas and are designed uniquely for your baby's needs.  Breast milk improves your baby's brain development.  Your baby is less likely to develop  other conditions, such as childhood obesity, asthma, or type 2 diabetes mellitus. For You  Breastfeeding helps to create a very special bond between you and your baby.  Breastfeeding is convenient. Breast milk is always available at the correct temperature and costs nothing.  Breastfeeding helps to burn calories and helps you lose the weight gained during pregnancy.  Breastfeeding makes your uterus contract to its prepregnancy size faster and slows bleeding (lochia) after you give birth.   Breastfeeding helps to lower your risk of developing type 2 diabetes mellitus, osteoporosis, and breast or ovarian cancer later in life. SIGNS THAT YOUR BABY IS HUNGRY Early Signs of Hunger  Increased alertness or activity.  Stretching.  Movement of the head from side to side.  Movement of the head and opening of the mouth when the corner of the mouth or cheek is stroked (rooting).  Increased sucking sounds, smacking lips, cooing, sighing, or squeaking.  Hand-to-mouth movements.  Increased sucking of fingers or hands. Late Signs of Hunger  Fussing.  Intermittent crying. Extreme Signs of Hunger Signs of extreme hunger will require calming and consoling before your baby will be able to breastfeed successfully. Do not wait for the following signs of extreme hunger to occur before you initiate breastfeeding:  Restlessness.  A loud, strong cry.  Screaming. BREASTFEEDING BASICS Breastfeeding Initiation  Find a comfortable place to sit or lie down, with your neck and back well supported.  Place a pillow or rolled up blanket under your baby to bring him or her to the level of your breast (if you are seated). Nursing pillows are specially designed to help support your arms and your baby while you breastfeed.  Make sure that your baby's abdomen is facing your abdomen.  Gently massage your breast. With your fingertips, massage from your chest wall toward your nipple in a circular motion.  This encourages milk flow. You may need to continue this action during the feeding if your milk flows slowly.  Support your breast with 4 fingers underneath and your thumb above your nipple. Make sure your fingers are well away from your nipple and your baby's mouth.  Stroke your baby's lips gently with your finger or nipple.  When your baby's mouth is open wide enough, quickly bring your baby to your breast, placing your entire nipple and as much of the colored area around your nipple (areola) as possible into your baby's mouth.  More areola should be visible above your baby's upper lip than below the lower lip.  Your baby's tongue should be between his or her lower gum and your breast.  Ensure that your baby's mouth is correctly positioned around your nipple (latched). Your baby's lips should create a seal on your breast and be turned out (everted).  It is common for your baby to suck about 2-3 minutes in order to start the flow of breast milk. Latching Teaching your baby how to latch on to your breast properly is very important. An improper latch can cause nipple pain and decreased milk supply for   you and poor weight gain in your baby. Also, if your baby is not latched onto your nipple properly, he or she may swallow some air during feeding. This can make your baby fussy. Burping your baby when you switch breasts during the feeding can help to get rid of the air. However, teaching your baby to latch on properly is still the best way to prevent fussiness from swallowing air while breastfeeding. Signs that your baby has successfully latched on to your nipple:  Silent tugging or silent sucking, without causing you pain.  Swallowing heard between every 3-4 sucks.  Muscle movement above and in front of his or her ears while sucking. Signs that your baby has not successfully latched on to nipple:  Sucking sounds or smacking sounds from your baby while breastfeeding.  Nipple pain. If you  think your baby has not latched on correctly, slip your finger into the corner of your baby's mouth to break the suction and place it between your baby's gums. Attempt breastfeeding initiation again. Signs of Successful Breastfeeding Signs from your baby:  A gradual decrease in the number of sucks or complete cessation of sucking.  Falling asleep.  Relaxation of his or her body.  Retention of a small amount of milk in his or her mouth.  Letting go of your breast by himself or herself. Signs from you:  Breasts that have increased in firmness, weight, and size 1-3 hours after feeding.  Breasts that are softer immediately after breastfeeding.  Increased milk volume, as well as a change in milk consistency and color by the fifth day of breastfeeding.  Nipples that are not sore, cracked, or bleeding. Signs That Your Baby is Getting Enough Milk  Wetting at least 3 diapers in a 24-hour period. The urine should be clear and pale yellow by age 5 days.  At least 3 stools in a 24-hour period by age 5 days. The stool should be soft and yellow.  At least 3 stools in a 24-hour period by age 7 days. The stool should be seedy and yellow.  No loss of weight greater than 10% of birth weight during the first 3 days of age.  Average weight gain of 4-7 ounces (113-198 g) per week after age 4 days.  Consistent daily weight gain by age 5 days, without weight loss after the age of 2 weeks. After a feeding, your baby may spit up a small amount. This is common. BREASTFEEDING FREQUENCY AND DURATION Frequent feeding will help you make more milk and can prevent sore nipples and breast engorgement. Breastfeed when you feel the need to reduce the fullness of your breasts or when your baby shows signs of hunger. This is called "breastfeeding on demand." Avoid introducing a pacifier to your baby while you are working to establish breastfeeding (the first 4-6 weeks after your baby is born). After this time you  may choose to use a pacifier. Research has shown that pacifier use during the first year of a baby's life decreases the risk of sudden infant death syndrome (SIDS). Allow your baby to feed on each breast as long as he or she wants. Breastfeed until your baby is finished feeding. When your baby unlatches or falls asleep while feeding from the first breast, offer the second breast. Because newborns are often sleepy in the first few weeks of life, you may need to awaken your baby to get him or her to feed. Breastfeeding times will vary from baby to baby. However, the following   rules can serve as a guide to help you ensure that your baby is properly fed:  Newborns (babies 4 weeks of age or younger) may breastfeed every 1-3 hours.  Newborns should not go longer than 3 hours during the day or 5 hours during the night without breastfeeding.  You should breastfeed your baby a minimum of 8 times in a 24-hour period until you begin to introduce solid foods to your baby at around 6 months of age. BREAST MILK PUMPING Pumping and storing breast milk allows you to ensure that your baby is exclusively fed your breast milk, even at times when you are unable to breastfeed. This is especially important if you are going back to work while you are still breastfeeding or when you are not able to be present during feedings. Your lactation consultant can give you guidelines on how long it is safe to store breast milk. A breast pump is a machine that allows you to pump milk from your breast into a sterile bottle. The pumped breast milk can then be stored in a refrigerator or freezer. Some breast pumps are operated by hand, while others use electricity. Ask your lactation consultant which type will work best for you. Breast pumps can be purchased, but some hospitals and breastfeeding support groups lease breast pumps on a monthly basis. A lactation consultant can teach you how to hand express breast milk, if you prefer not to use  a pump. CARING FOR YOUR BREASTS WHILE YOU BREASTFEED Nipples can become dry, cracked, and sore while breastfeeding. The following recommendations can help keep your breasts moisturized and healthy:  Avoid using soap on your nipples.  Wear a supportive bra. Although not required, special nursing bras and tank tops are designed to allow access to your breasts for breastfeeding without taking off your entire bra or top. Avoid wearing underwire-style bras or extremely tight bras.  Air dry your nipples for 3-4minutes after each feeding.  Use only cotton bra pads to absorb leaked breast milk. Leaking of breast milk between feedings is normal.  Use lanolin on your nipples after breastfeeding. Lanolin helps to maintain your skin's normal moisture barrier. If you use pure lanolin, you do not need to wash it off before feeding your baby again. Pure lanolin is not toxic to your baby. You may also hand express a few drops of breast milk and gently massage that milk into your nipples and allow the milk to air dry. In the first few weeks after giving birth, some women experience extremely full breasts (engorgement). Engorgement can make your breasts feel heavy, warm, and tender to the touch. Engorgement peaks within 3-5 days after you give birth. The following recommendations can help ease engorgement:  Completely empty your breasts while breastfeeding or pumping. You may want to start by applying warm, moist heat (in the shower or with warm water-soaked hand towels) just before feeding or pumping. This increases circulation and helps the milk flow. If your baby does not completely empty your breasts while breastfeeding, pump any extra milk after he or she is finished.  Wear a snug bra (nursing or regular) or tank top for 1-2 days to signal your body to slightly decrease milk production.  Apply ice packs to your breasts, unless this is too uncomfortable for you.  Make sure that your baby is latched on and  positioned properly while breastfeeding. If engorgement persists after 48 hours of following these recommendations, contact your health care provider or a lactation consultant. OVERALL HEALTH   CARE RECOMMENDATIONS WHILE BREASTFEEDING  Eat healthy foods. Alternate between meals and snacks, eating 3 of each per day. Because what you eat affects your breast milk, some of the foods may make your baby more irritable than usual. Avoid eating these foods if you are sure that they are negatively affecting your baby.  Drink milk, fruit juice, and water to satisfy your thirst (about 10 glasses a day).  Rest often, relax, and continue to take your prenatal vitamins to prevent fatigue, stress, and anemia.  Continue breast self-awareness checks.  Avoid chewing and smoking tobacco. Chemicals from cigarettes that pass into breast milk and exposure to secondhand smoke may harm your baby.  Avoid alcohol and drug use, including marijuana. Some medicines that may be harmful to your baby can pass through breast milk. It is important to ask your health care provider before taking any medicine, including all over-the-counter and prescription medicine as well as vitamin and herbal supplements. It is possible to become pregnant while breastfeeding. If birth control is desired, ask your health care provider about options that will be safe for your baby. SEEK MEDICAL CARE IF:  You feel like you want to stop breastfeeding or have become frustrated with breastfeeding.  You have painful breasts or nipples.  Your nipples are cracked or bleeding.  Your breasts are red, tender, or warm.  You have a swollen area on either breast.  You have a fever or chills.  You have nausea or vomiting.  You have drainage other than breast milk from your nipples.  Your breasts do not become full before feedings by the fifth day after you give birth.  You feel sad and depressed.  Your baby is too sleepy to eat well.  Your  baby is having trouble sleeping.   Your baby is wetting less than 3 diapers in a 24-hour period.  Your baby has less than 3 stools in a 24-hour period.  Your baby's skin or the white part of his or her eyes becomes yellow.   Your baby is not gaining weight by 5 days of age. SEEK IMMEDIATE MEDICAL CARE IF:  Your baby is overly tired (lethargic) and does not want to wake up and feed.  Your baby develops an unexplained fever.   This information is not intended to replace advice given to you by your health care provider. Make sure you discuss any questions you have with your health care provider.   Document Released: 04/11/2005 Document Revised: 12/31/2014 Document Reviewed: 10/03/2012 Elsevier Interactive Patient Education 2016 [employer]  

## 2015-05-18 NOTE — Progress Notes (Signed)
Subjective:  Beverly Mayer is a 35 y.o. G3P1101 at [redacted]w[redacted]d being seen today for ongoing prenatal care.  She is currently monitored for the following issues for this high-risk pregnancy and has Supervision of high risk pregnancy, antepartum; Gestational diabetes; Previous cesarean section complicating pregnancy, antepartum condition or complication; and NSAID in pregnancy, third trimester on her problem list.  Patient reports no complaints.  Contractions: Not present. Vag. Bleeding: None.  Movement: Present. Denies leaking of fluid.   The following portions of the patient's history were reviewed and updated as appropriate: allergies, current medications, past family history, past medical history, past social history, past surgical history and problem list. Problem list updated.  Objective:   Filed Vitals:   05/18/15 0843  BP: 114/77  Pulse: 96  Temp: 98.3 F (36.8 C)  Weight: 149 lb (67.586 kg)    Fetal Status: Fetal Heart Rate (bpm): 126 Fundal Height: 39 cm Movement: Present  Presentation: Vertex  General:  Alert, oriented and cooperative. Patient is in no acute distress.  Skin: Skin is warm and dry. No rash noted.   Cardiovascular: Normal heart rate noted  Respiratory: Normal respiratory effort, no problems with respiration noted  Abdomen: Soft, gravid, appropriate for gestational age. Pain/Pressure: Present     Pelvic: Vag. Bleeding: None     Cervical exam deferred        Extremities: Normal range of motion.  Edema: None  Mental Status: Normal mood and affect. Normal behavior. Normal judgment and thought content.   No book today--patient reports FBS 80's 2 hour pp 110-115 Assessment and Plan:  Pregnancy: G3P1101 at [redacted]w[redacted]d  1. Supervision of high risk pregnancy, antepartum, third trimester Continue prenatal care.   2. Previous cesarean section complicating pregnancy, antepartum condition or complication For repeat at 39 wks  3. Gestational diabetes mellitus in third  trimester, unspecified diabetic control Ok control  Term labor symptoms and general obstetric precautions including but not limited to vaginal bleeding, contractions, leaking of fluid and fetal movement were reviewed in detail with the patient. Please refer to After Visit Summary for other counseling recommendations.  Return in 6 weeks (on 06/29/2015) for pp check.   Reva Bores, MD

## 2015-05-20 NOTE — Patient Instructions (Signed)
Your procedure is scheduled on:  Saturday, Jan. 28, 2017  Enter through the Maternity Admissions of [facility] at:  10:15 A.M.  Sign your name on the clipboard at the desk and inform the staff you are a Pre-Registered Cesarean Section  Remember: Do NOT eat food or drink after:  Midnight Friday  Take these medicines the morning of surgery with a SIP OF WATER:  None  Do NOT wear jewelry (body piercing), metal hair clips/bobby pins, or nail polish. Do NOT wear lotions, powders, or perfumes.  You may wear deoderant. Do NOT shave for 48 hours prior to surgery. Do NOT bring valuables to the hospital.  Leave suitcase in car.  After surgery it may be brought to your room.  For patients admitted to the hospital, checkout time is 11:00 AM the day of discharge.

## 2015-05-21 ENCOUNTER — Encounter (HOSPITAL_COMMUNITY): Payer: Self-pay

## 2015-05-21 ENCOUNTER — Encounter (HOSPITAL_COMMUNITY)
Admission: RE | Admit: 2015-05-21 | Discharge: 2015-05-21 | Disposition: A | Discharge status: Home / Self Care | Payer: BLUE CROSS/BLUE SHIELD | Source: Ambulatory Visit | Attending: Obstetrics & Gynecology | Admitting: Obstetrics & Gynecology

## 2015-05-21 DIAGNOSIS — O0993 Supervision of high risk pregnancy, unspecified, third trimester: Secondary | ICD-10-CM

## 2015-05-21 DIAGNOSIS — O24419 Gestational diabetes mellitus in pregnancy, unspecified control: Secondary | ICD-10-CM | POA: Insufficient documentation

## 2015-05-21 DIAGNOSIS — Z01812 Encounter for preprocedural laboratory examination: Secondary | ICD-10-CM | POA: Diagnosis present

## 2015-05-21 DIAGNOSIS — Z3A38 38 weeks gestation of pregnancy: Secondary | ICD-10-CM | POA: Diagnosis not present

## 2015-05-21 DIAGNOSIS — O34219 Maternal care for unspecified type scar from previous cesarean delivery: Secondary | ICD-10-CM | POA: Insufficient documentation

## 2015-05-21 LAB — CBC
HCT: 33.4 % — ABNORMAL LOW (ref 36.0–46.0)
Hemoglobin: 10.5 g/dL — ABNORMAL LOW (ref 12.0–15.0)
MCH: 25 pg — AB (ref 26.0–34.0)
MCHC: 31.4 g/dL (ref 30.0–36.0)
MCV: 79.5 fL (ref 78.0–100.0)
Platelets: 229 10*3/uL (ref 150–400)
RBC: 4.2 MIL/uL (ref 3.87–5.11)
RDW: 15.8 % — ABNORMAL HIGH (ref 11.5–15.5)
WBC: 6.7 10*3/uL (ref 4.0–10.5)

## 2015-05-21 LAB — BASIC METABOLIC PANEL
ANION GAP: 9 (ref 5–15)
BUN: 9 mg/dL (ref 6–20)
CHLORIDE: 106 mmol/L (ref 101–111)
CO2: 21 mmol/L — AB (ref 22–32)
CREATININE: 0.57 mg/dL (ref 0.44–1.00)
Calcium: 8.9 mg/dL (ref 8.9–10.3)
GFR calc non Af Amer: >60 mL/min (ref >60)
Glucose, Bld: 93 mg/dL (ref 65–99)
POTASSIUM: 4 mmol/L (ref 3.5–5.1)
SODIUM: 136 mmol/L (ref 135–145)

## 2015-05-21 LAB — TYPE AND SCREEN
ABO/RH(D): O POS
Antibody Screen: NEGATIVE

## 2015-05-21 LAB — ABO/RH: ABO/RH(D): O POS

## 2015-05-22 LAB — RPR: RPR Ser Ql: NONREACTIVE

## 2015-05-22 MED ORDER — DEXTROSE 5 % IV SOLN
2.0000 g | INTRAVENOUS | Status: AC
  Administered 2015-05-23 (×2): 2 g via INTRAVENOUS
  Filled 2015-05-22: qty 2

## 2015-05-23 ENCOUNTER — Encounter (HOSPITAL_COMMUNITY)
Admission: RE | Disposition: A | Discharge status: Home / Self Care | Payer: Self-pay | Source: Ambulatory Visit | Attending: Obstetrics & Gynecology

## 2015-05-23 ENCOUNTER — Encounter (HOSPITAL_COMMUNITY): Payer: Self-pay | Admitting: *Deleted

## 2015-05-23 ENCOUNTER — Encounter (HOSPITAL_COMMUNITY): Payer: Self-pay | Admitting: Obstetrics & Gynecology

## 2015-05-23 ENCOUNTER — Inpatient Hospital Stay (HOSPITAL_COMMUNITY)
Admission: RE | Admit: 2015-05-23 | Discharge: 2015-05-26 | DRG: 766 | Disposition: A | Discharge status: Home / Self Care | Payer: Medicaid Other | Source: Ambulatory Visit | Attending: Obstetrics & Gynecology | Admitting: Obstetrics & Gynecology

## 2015-05-23 ENCOUNTER — Inpatient Hospital Stay (HOSPITAL_COMMUNITY): Payer: Medicaid Other | Admitting: Anesthesiology

## 2015-05-23 DIAGNOSIS — O34219 Maternal care for unspecified type scar from previous cesarean delivery: Secondary | ICD-10-CM

## 2015-05-23 DIAGNOSIS — O0993 Supervision of high risk pregnancy, unspecified, third trimester: Secondary | ICD-10-CM

## 2015-05-23 DIAGNOSIS — O24429 Gestational diabetes mellitus in childbirth, unspecified control: Secondary | ICD-10-CM | POA: Diagnosis present

## 2015-05-23 DIAGNOSIS — Z3A39 39 weeks gestation of pregnancy: Secondary | ICD-10-CM

## 2015-05-23 DIAGNOSIS — Z8249 Family history of ischemic heart disease and other diseases of the circulatory system: Secondary | ICD-10-CM | POA: Diagnosis not present

## 2015-05-23 DIAGNOSIS — O34211 Maternal care for low transverse scar from previous cesarean delivery: Secondary | ICD-10-CM | POA: Diagnosis present

## 2015-05-23 DIAGNOSIS — O0990 Supervision of high risk pregnancy, unspecified, unspecified trimester: Secondary | ICD-10-CM

## 2015-05-23 DIAGNOSIS — Z302 Encounter for sterilization: Secondary | ICD-10-CM

## 2015-05-23 DIAGNOSIS — Z98891 History of uterine scar from previous surgery: Secondary | ICD-10-CM

## 2015-05-23 DIAGNOSIS — O24419 Gestational diabetes mellitus in pregnancy, unspecified control: Secondary | ICD-10-CM | POA: Diagnosis present

## 2015-05-23 HISTORY — DX: Unspecified abnormal cytological findings in specimens from cervix uteri: R87.619

## 2015-05-23 LAB — GLUCOSE, CAPILLARY: Glucose-Capillary: 74 mg/dL (ref 65–99)

## 2015-05-23 SURGERY — Surgical Case
Anesthesia: Spinal | Site: Abdomen | Laterality: Bilateral

## 2015-05-23 MED ORDER — IBUPROFEN 600 MG PO TABS
600.0000 mg | ORAL_TABLET | Freq: Four times a day (QID) | ORAL | Status: DC
  Administered 2015-05-24 – 2015-05-26 (×10): 600 mg via ORAL
  Filled 2015-05-23 (×11): qty 1

## 2015-05-23 MED ORDER — PHENYLEPHRINE 8 MG IN D5W 100 ML (0.08MG/ML) PREMIX OPTIME
INJECTION | INTRAVENOUS | Status: AC
Start: 2015-05-23 — End: 2015-05-23
  Filled 2015-05-23: qty 100

## 2015-05-23 MED ORDER — BUPIVACAINE IN DEXTROSE 0.75-8.25 % IT SOLN
INTRATHECAL | Status: DC | PRN
  Administered 2015-05-23: 1.6 mg via INTRATHECAL

## 2015-05-23 MED ORDER — ONDANSETRON HCL 4 MG/2ML IJ SOLN
4.0000 mg | Freq: Three times a day (TID) | INTRAMUSCULAR | Status: DC | PRN
  Administered 2015-05-23: 4 mg via INTRAVENOUS
  Filled 2015-05-23: qty 2

## 2015-05-23 MED ORDER — SODIUM CHLORIDE 0.9% FLUSH
3.0000 mL | INTRAVENOUS | Status: DC | PRN
Start: 2015-05-23 — End: 2015-05-26

## 2015-05-23 MED ORDER — NALBUPHINE HCL 10 MG/ML IJ SOLN: 5.0000 mg | Freq: Once | INTRAMUSCULAR | Status: DC | PRN

## 2015-05-23 MED ORDER — DIBUCAINE 1 % RE OINT: 1.0000 "application " | TOPICAL_OINTMENT | RECTAL | Status: DC | PRN

## 2015-05-23 MED ORDER — ONDANSETRON HCL 4 MG/2ML IJ SOLN: 4.0000 mg | Freq: Once | INTRAMUSCULAR | Status: DC | PRN

## 2015-05-23 MED ORDER — KETOROLAC TROMETHAMINE 30 MG/ML IJ SOLN: 30.0000 mg | Freq: Four times a day (QID) | INTRAMUSCULAR | Status: AC | PRN

## 2015-05-23 MED ORDER — LACTATED RINGERS IV SOLN
INTRAVENOUS | Status: DC
  Administered 2015-05-23 – 2015-05-24 (×2): via INTRAVENOUS

## 2015-05-23 MED ORDER — SODIUM CHLORIDE 0.9 % IR SOLN
Status: DC | PRN
  Administered 2015-05-23: 1000 mL

## 2015-05-23 MED ORDER — LACTATED RINGERS IV SOLN: 2.5000 [IU]/h | INTRAVENOUS | Status: AC

## 2015-05-23 MED ORDER — IBUPROFEN 600 MG PO TABS: 600.0000 mg | ORAL_TABLET | Freq: Four times a day (QID) | ORAL | Status: DC

## 2015-05-23 MED ORDER — ONDANSETRON HCL 4 MG/2ML IJ SOLN
INTRAMUSCULAR | Status: DC | PRN
  Administered 2015-05-23: 4 mg via INTRAVENOUS

## 2015-05-23 MED ORDER — SIMETHICONE 80 MG PO CHEW
80.0000 mg | CHEWABLE_TABLET | ORAL | Status: DC
  Administered 2015-05-24 – 2015-05-25 (×3): 80 mg via ORAL
  Filled 2015-05-23 (×3): qty 1

## 2015-05-23 MED ORDER — NALBUPHINE HCL 10 MG/ML IJ SOLN: 5.0000 mg | INTRAMUSCULAR | Status: DC | PRN

## 2015-05-23 MED ORDER — FERROUS SULFATE 325 (65 FE) MG PO TABS
325.0000 mg | ORAL_TABLET | Freq: Two times a day (BID) | ORAL | Status: DC
Start: 2015-05-23 — End: 2015-05-26
  Administered 2015-05-24 – 2015-05-26 (×3): 325 mg via ORAL
  Filled 2015-05-23 (×8): qty 1

## 2015-05-23 MED ORDER — WITCH HAZEL-GLYCERIN EX PADS: 1.0000 "application " | MEDICATED_PAD | CUTANEOUS | Status: DC | PRN

## 2015-05-23 MED ORDER — PHENYLEPHRINE 8 MG IN D5W 100 ML (0.08MG/ML) PREMIX OPTIME
INJECTION | INTRAVENOUS | Status: DC | PRN
  Administered 2015-05-23: 60 ug/min via INTRAVENOUS

## 2015-05-23 MED ORDER — KETOROLAC TROMETHAMINE 30 MG/ML IJ SOLN
INTRAMUSCULAR | Status: AC
  Administered 2015-05-23: 30 mg via INTRAMUSCULAR
  Filled 2015-05-23: qty 1

## 2015-05-23 MED ORDER — NALOXONE HCL 0.4 MG/ML IJ SOLN: 0.4000 mg | INTRAMUSCULAR | Status: DC | PRN

## 2015-05-23 MED ORDER — PRENATAL MULTIVITAMIN CH
1.0000 | ORAL_TABLET | Freq: Every day | ORAL | Status: DC
  Administered 2015-05-24 – 2015-05-26 (×2): 1 via ORAL
  Filled 2015-05-23 (×5): qty 1

## 2015-05-23 MED ORDER — DIPHENHYDRAMINE HCL 50 MG/ML IJ SOLN: 12.5000 mg | INTRAMUSCULAR | Status: DC | PRN

## 2015-05-23 MED ORDER — MEPERIDINE HCL 25 MG/ML IJ SOLN: 6.2500 mg | INTRAMUSCULAR | Status: DC | PRN

## 2015-05-23 MED ORDER — LANOLIN HYDROUS EX OINT: 1.0000 "application " | TOPICAL_OINTMENT | CUTANEOUS | Status: DC | PRN

## 2015-05-23 MED ORDER — DIPHENHYDRAMINE HCL 25 MG PO CAPS
25.0000 mg | ORAL_CAPSULE | Freq: Four times a day (QID) | ORAL | Status: DC | PRN
  Filled 2015-05-23: qty 1

## 2015-05-23 MED ORDER — FENTANYL CITRATE (PF) 100 MCG/2ML IJ SOLN
INTRAMUSCULAR | Status: AC
  Filled 2015-05-23: qty 2

## 2015-05-23 MED ORDER — MAGNESIUM HYDROXIDE 400 MG/5ML PO SUSP: 30.0000 mL | ORAL | Status: DC | PRN

## 2015-05-23 MED ORDER — SCOPOLAMINE 1 MG/3DAYS TD PT72: 1.0000 | MEDICATED_PATCH | Freq: Once | TRANSDERMAL | Status: DC

## 2015-05-23 MED ORDER — LACTATED RINGERS IV SOLN
INTRAVENOUS | Status: DC
  Administered 2015-05-23 (×3): via INTRAVENOUS

## 2015-05-23 MED ORDER — INFLUENZA VAC SPLIT QUAD 0.5 ML IM SUSY
0.5000 mL | PREFILLED_SYRINGE | INTRAMUSCULAR | Status: AC
Start: 2015-05-24 — End: 2015-05-24
  Filled 2015-05-23: qty 0.5

## 2015-05-23 MED ORDER — CITRIC ACID-SODIUM CITRATE 334-500 MG/5ML PO SOLN
30.0000 mL | ORAL | Status: AC
  Administered 2015-05-23: 30 mL via ORAL

## 2015-05-23 MED ORDER — FENTANYL CITRATE (PF) 100 MCG/2ML IJ SOLN
INTRAMUSCULAR | Status: DC | PRN
  Administered 2015-05-23: 10 ug via INTRATHECAL

## 2015-05-23 MED ORDER — FENTANYL CITRATE (PF) 100 MCG/2ML IJ SOLN: 25.0000 ug | INTRAMUSCULAR | Status: DC | PRN

## 2015-05-23 MED ORDER — LACTATED RINGERS IV SOLN
40.0000 [IU] | INTRAVENOUS | Status: DC | PRN
  Administered 2015-05-23: 40 [IU] via INTRAVENOUS

## 2015-05-23 MED ORDER — ONDANSETRON HCL 4 MG/2ML IJ SOLN
INTRAMUSCULAR | Status: AC
  Filled 2015-05-23: qty 2

## 2015-05-23 MED ORDER — MORPHINE SULFATE (PF) 0.5 MG/ML IJ SOLN
INTRAMUSCULAR | Status: DC | PRN
  Administered 2015-05-23: .2 mg via INTRATHECAL

## 2015-05-23 MED ORDER — ACETAMINOPHEN 325 MG PO TABS
650.0000 mg | ORAL_TABLET | ORAL | Status: DC | PRN
  Administered 2015-05-26: 650 mg via ORAL
  Filled 2015-05-23: qty 2

## 2015-05-23 MED ORDER — MEASLES, MUMPS & RUBELLA VAC ~~LOC~~ INJ
0.5000 mL | INJECTION | Freq: Once | SUBCUTANEOUS | Status: DC
  Filled 2015-05-23: qty 0.5

## 2015-05-23 MED ORDER — MENTHOL 3 MG MT LOZG: 1.0000 | LOZENGE | OROMUCOSAL | Status: DC | PRN

## 2015-05-23 MED ORDER — MORPHINE SULFATE (PF) 0.5 MG/ML IJ SOLN
INTRAMUSCULAR | Status: AC
  Filled 2015-05-23: qty 10

## 2015-05-23 MED ORDER — BUPIVACAINE HCL (PF) 0.5 % IJ SOLN
INTRAMUSCULAR | Status: DC | PRN
  Administered 2015-05-23: 16 mL

## 2015-05-23 MED ORDER — SENNOSIDES-DOCUSATE SODIUM 8.6-50 MG PO TABS
2.0000 | ORAL_TABLET | ORAL | Status: DC
  Administered 2015-05-24 – 2015-05-25 (×3): 2 via ORAL
  Filled 2015-05-23 (×5): qty 2

## 2015-05-23 MED ORDER — OXYCODONE-ACETAMINOPHEN 5-325 MG PO TABS
2.0000 | ORAL_TABLET | ORAL | Status: DC | PRN
  Filled 2015-05-23: qty 2

## 2015-05-23 MED ORDER — OXYTOCIN 10 UNIT/ML IJ SOLN
INTRAMUSCULAR | Status: AC
  Filled 2015-05-23: qty 4

## 2015-05-23 MED ORDER — TETANUS-DIPHTH-ACELL PERTUSSIS 5-2.5-18.5 LF-MCG/0.5 IM SUSP: 0.5000 mL | Freq: Once | INTRAMUSCULAR | Status: DC

## 2015-05-23 MED ORDER — BUPIVACAINE HCL (PF) 0.5 % IJ SOLN
INTRAMUSCULAR | Status: AC
  Filled 2015-05-23: qty 30

## 2015-05-23 MED ORDER — DIPHENHYDRAMINE HCL 25 MG PO CAPS
25.0000 mg | ORAL_CAPSULE | ORAL | Status: DC | PRN
  Filled 2015-05-23: qty 1

## 2015-05-23 MED ORDER — ZOLPIDEM TARTRATE 5 MG PO TABS: 5.0000 mg | ORAL_TABLET | Freq: Every evening | ORAL | Status: DC | PRN

## 2015-05-23 MED ORDER — ACETAMINOPHEN 500 MG PO TABS: 1000.0000 mg | ORAL_TABLET | Freq: Four times a day (QID) | ORAL | Status: AC

## 2015-05-23 MED ORDER — SIMETHICONE 80 MG PO CHEW: 80.0000 mg | CHEWABLE_TABLET | ORAL | Status: DC | PRN

## 2015-05-23 MED ORDER — OXYCODONE-ACETAMINOPHEN 5-325 MG PO TABS
1.0000 | ORAL_TABLET | ORAL | Status: DC | PRN
  Administered 2015-05-24 – 2015-05-26 (×9): 1 via ORAL
  Filled 2015-05-23 (×8): qty 1

## 2015-05-23 MED ORDER — NALOXONE HCL 2 MG/2ML IJ SOSY
1.0000 ug/kg/h | PREFILLED_SYRINGE | INTRAMUSCULAR | Status: DC | PRN
  Filled 2015-05-23: qty 2

## 2015-05-23 MED ORDER — CITRIC ACID-SODIUM CITRATE 334-500 MG/5ML PO SOLN
ORAL | Status: AC
  Filled 2015-05-23: qty 15

## 2015-05-23 MED ORDER — INFLUENZA VAC SPLIT QUAD 0.5 ML IM SUSY
0.5000 mL | PREFILLED_SYRINGE | INTRAMUSCULAR | Status: AC
  Administered 2015-05-24: 0.5 mL via INTRAMUSCULAR

## 2015-05-23 SURGICAL SUPPLY — 36 items
APL SKNCLS STERI-STRIP NONHPOA (GAUZE/BANDAGES/DRESSINGS) ×1
BENZOIN TINCTURE PRP APPL 2/3 (GAUZE/BANDAGES/DRESSINGS) ×2 IMPLANT
CLAMP CORD UMBIL (MISCELLANEOUS) ×2 IMPLANT
CLIP FILSHIE TUBAL LIGA STRL (Clip) ×2 IMPLANT
CLOSURE WOUND 1/2 X4 (GAUZE/BANDAGES/DRESSINGS) ×1
CLOTH BEACON ORANGE TIMEOUT ST (SAFETY) ×3 IMPLANT
DRAPE SHEET LG 3/4 BI-LAMINATE (DRAPES) ×2 IMPLANT
DRSG OPSITE POSTOP 4X10 (GAUZE/BANDAGES/DRESSINGS) ×3 IMPLANT
DRSG PAD ABDOMINAL 8X10 ST (GAUZE/BANDAGES/DRESSINGS) ×2 IMPLANT
DURAPREP 26ML APPLICATOR (WOUND CARE) ×3 IMPLANT
ELECT REM PT RETURN 9FT ADLT (ELECTROSURGICAL) ×3
ELECTRODE REM PT RTRN 9FT ADLT (ELECTROSURGICAL) ×1 IMPLANT
GLOVE BIOGEL PI IND STRL 7.0 (GLOVE) ×3 IMPLANT
GLOVE BIOGEL PI INDICATOR 7.0 (GLOVE) ×6
GLOVE ECLIPSE 7.0 STRL STRAW (GLOVE) ×3 IMPLANT
GOWN STRL REUS W/TWL LRG LVL3 (GOWN DISPOSABLE) ×6 IMPLANT
NDL HYPO 25X5/8 SAFETYGLIDE (NEEDLE) ×1 IMPLANT
NEEDLE HYPO 22GX1.5 SAFETY (NEEDLE) ×3 IMPLANT
NEEDLE HYPO 25X5/8 SAFETYGLIDE (NEEDLE) ×3 IMPLANT
NS IRRIG 1000ML POUR BTL (IV SOLUTION) ×3 IMPLANT
PACK C SECTION WH (CUSTOM PROCEDURE TRAY) ×3 IMPLANT
PAD ABD 7.5X8 STRL (GAUZE/BANDAGES/DRESSINGS) ×3 IMPLANT
PAD OB MATERNITY 4.3X12.25 (PERSONAL CARE ITEMS) ×3 IMPLANT
PENCIL SMOKE EVAC W/HOLSTER (ELECTROSURGICAL) ×3 IMPLANT
RTRCTR C-SECT PINK 25CM LRG (MISCELLANEOUS) ×2 IMPLANT
SPONGE GAUZE 4X4 12PLY STER LF (GAUZE/BANDAGES/DRESSINGS) ×4 IMPLANT
STRIP CLOSURE SKIN 1/2X4 (GAUZE/BANDAGES/DRESSINGS) ×1 IMPLANT
SUT PDS AB 0 CTX 36 PDP370T (SUTURE) ×3 IMPLANT
SUT PLAIN 2 0 XLH (SUTURE) IMPLANT
SUT VIC AB 0 CTX 36 (SUTURE) ×6
SUT VIC AB 0 CTX36XBRD ANBCTRL (SUTURE) ×2 IMPLANT
SUT VIC AB 4-0 KS 27 (SUTURE) ×3 IMPLANT
SYR CONTROL 10ML LL (SYRINGE) ×3 IMPLANT
TAPE CLOTH SURG 4X10 WHT LF (GAUZE/BANDAGES/DRESSINGS) ×2 IMPLANT
TOWEL OR 17X24 6PK STRL BLUE (TOWEL DISPOSABLE) ×3 IMPLANT
TRAY FOLEY CATH SILVER 14FR (SET/KITS/TRAYS/PACK) ×3 IMPLANT

## 2015-05-23 NOTE — Transfer of Care (Signed)
Immediate Anesthesia Transfer of Care Note  Patient: Beverly Mayer  Procedure(s) Performed: Procedure(s): REPEAT CESAREAN SECTION WITH BILATERAL TUBAL LIGATION (Bilateral)  Patient Location: PACU  Anesthesia Type:Spinal  Level of Consciousness: awake  Airway & Oxygen Therapy: Patient Spontanous Breathing  Post-op Assessment: Report given to RN  Post vital signs: Reviewed and stable  Last Vitals:  Filed Vitals:   05/23/15 0948  BP: 113/78  Pulse: 93  Temp: 36.5 C    Complications: No apparent anesthesia complications

## 2015-05-23 NOTE — Anesthesia Postprocedure Evaluation (Signed)
Anesthesia Post Note  Patient: Beverly Mayer  Procedure(s) Performed: Procedure(s) (LRB): REPEAT CESAREAN SECTION WITH BILATERAL TUBAL LIGATION (Bilateral)  Patient location during evaluation: Mother Baby Anesthesia Type: Spinal Level of consciousness: awake Pain management: pain level controlled Vital Signs Assessment: post-procedure vital signs reviewed and stable Respiratory status: spontaneous breathing Cardiovascular status: stable Postop Assessment: no headache, no backache, spinal receding, patient able to bend at knees, no signs of nausea or vomiting and adequate PO intake Anesthetic complications: no    Last Vitals:  Filed Vitals:   05/23/15 1334 05/23/15 1440  BP: 111/70 131/72  Pulse: 51 61  Temp:  35.6 C  Resp: 20 20    Last Pain: There were no vitals filed for this visit.               [name]

## 2015-05-23 NOTE — Anesthesia Postprocedure Evaluation (Signed)
Anesthesia Post Note  Patient: Beverly Mayer  Procedure(s) Performed: Procedure(s) (LRB): REPEAT CESAREAN SECTION WITH BILATERAL TUBAL LIGATION (Bilateral)  Patient location during evaluation: PACU Anesthesia Type: Spinal Level of consciousness: awake and alert Pain management: pain level controlled Vital Signs Assessment: post-procedure vital signs reviewed and stable Respiratory status: spontaneous breathing, nonlabored ventilation, respiratory function stable and patient connected to nasal cannula oxygen Cardiovascular status: blood pressure returned to baseline and stable Postop Assessment: no signs of nausea or vomiting and spinal receding Anesthetic complications: no    Last Vitals:  Filed Vitals:   05/23/15 1315 05/23/15 1316  BP: 106/65 106/65  Pulse: 56 56  Temp:    Resp: 22 24    Last Pain: There were no vitals filed for this visit.               Simcha Speir JENNETTE

## 2015-05-23 NOTE — Addendum Note (Signed)
Addendum  created 05/23/15 1535 by Renford Dills, CRNA   Modules edited: Clinical Notes   Clinical Notes:  File: [id number]

## 2015-05-23 NOTE — Op Note (Signed)
Beverly Mayer PROCEDURE DATE: 05/23/2015  PREOPERATIVE DIAGNOSES: Intrauterine pregnancy at [redacted]w[redacted]d weeks gestation; previous cesarean section; undesired fertility  POSTOPERATIVE DIAGNOSES: The same  PROCEDURE: Repeat Low Transverse Cesarean Section, Bilateral Tubal Sterilization using Filshie clips  SURGEON:  Dr. Jaynie Collins  ASSISTANT:  Dr. Shonna Chock  ANESTHESIOLOGIST: Dr. Karie Schwalbe  INDICATIONS: Beverly Mayer is a 35 y.o. G3P1101 at [redacted]w[redacted]d here for repeat cesarean section and bilateral tubal sterilization secondary to the indications listed under preoperative diagnoses; please see preoperative note for further details.  The risks of surgery were discussed with the patient including but were not limited to: bleeding which may require transfusion or reoperation; infection which may require antibiotics; injury to bowel, bladder, ureters or other surrounding organs; injury to the fetus; need for additional procedures including hysterectomy in the event of a life-threatening hemorrhage; placental abnormalities wth subsequent pregnancies, incisional problems, thromboembolic phenomenon and other postoperative/anesthesia complications.  Patient also desires permanent sterilization.  Other reversible forms of contraception were discussed with patient; she declines all other modalities. Risks of procedure discussed with patient including but not limited to: risk of regret, permanence of method, bleeding, infection, injury to surrounding organs and need for additional procedures.  Failure risk of 1-2% with increased risk of ectopic gestation if pregnancy occurs was also discussed with patient.  The patient concurred with the proposed plan, giving informed written consent for the procedures.    FINDINGS:  Viable female infant in cephalic presentation.  Apgars 9 and 9.  Clear amniotic fluid.  Intact placenta, three vessel cord.  Normal uterus, fallopian tubes and ovaries bilaterally. Fallopian tubes  sterilized with Filshie clips bilaterally. Minimal intraperitoneal adhesive disease.  ANESTHESIA: Spinal INTRAVENOUS FLUIDS: 2800 ml ESTIMATED BLOOD LOSS: 600 ml URINE OUTPUT:  200 ml SPECIMENS: Placenta sent to pathology COMPLICATIONS: None immediate  PROCEDURE IN DETAIL:  The patient preoperatively received intravenous antibiotics and had sequential compression devices applied to her lower extremities.   She was then taken to the operating room where spinal anesthesia was administered and was found to be adequate. She was then placed in a dorsal supine position with a leftward tilt, and prepped and draped in a sterile manner.  A foley catheter was placed into her bladder and attached to constant gravity.  After an adequate timeout was performed, a Pfannenstiel skin incision was made with scalpel over her preexisting scar and carried through to the underlying layer of fascia. The fascia was incised in the midline, and this incision was extended bilaterally using the Mayo scissors.  Kocher clamps were applied to the superior aspect of the fascial incision and the underlying rectus muscles were dissected off bluntly. A similar process was carried out on the inferior aspect of the fascial incision. The rectus muscles were separated in the midline bluntly and the peritoneum was entered bluntly. Attention was turned to the lower uterine segment where a low transverse hysterotomy was made with a scalpel and extended bilaterally bluntly.  The infant was successfully delivered, the cord was clamped and cut after one minute, and the infant was handed over to awaiting neonatology team. Uterine massage was then administered, and the placenta delivered intact with a three-vessel cord. The uterus was then cleared of clot and debris.  The hysterotomy was closed with 0 Vicryl in a running locked fashion, and an imbricating layer was also placed with 0 Vicryl.  One figure-of-eight 0 Vicryl serosal stitch was placed to  help with hemostasis.  Attention was then turned to the fallopian  tubes, and Filshie clips were placed about 3 cm from the cornua, with care given to incorporate the underlying mesosalpinx on both sides, allowing for bilateral tubal sterilization. The pelvis was cleared of all clot and debris. Hemostasis was confirmed on all surfaces.  The peritoneum and the muscles were reapproximated using 0 Vicryl interrupted stitches. The fascia was then closed using 0 PDS in a running fashion.  The subcutaneous layer was irrigated, then reapproximated with 2-0 plain gut interrupted stitches, and 15 ml of 0.5% Marcaine was injected subcutaneously around the incision.  The skin was closed with a 4-0 Vicryl subcuticular stitch. The patient tolerated the procedure well. Sponge, lap, instrument and needle counts were correct x 3.  She was taken to the recovery room in stable condition.    Jaynie Collins, MD, FACOG Attending Obstetrician & Gynecologist Faculty Practice, [facility]

## 2015-05-23 NOTE — Anesthesia Preprocedure Evaluation (Signed)
Anesthesia Evaluation  Patient identified by MRN, date of birth, ID band Patient awake    Reviewed: Allergy & Precautions, NPO status , Patient's Chart, lab work & pertinent test results  History of Anesthesia Complications Negative for: history of anesthetic complications  Airway Mallampati: II  TM Distance: >3 FB Neck ROM: Full    Dental no notable dental hx. (+) Dental Advisory Given   Pulmonary neg pulmonary ROS,    Pulmonary exam normal breath sounds clear to auscultation       Cardiovascular negative cardio ROS Normal cardiovascular exam Rhythm:Regular Rate:Normal     Neuro/Psych negative neurological ROS  negative psych ROS   GI/Hepatic negative GI ROS, Neg liver ROS,   Endo/Other  diabetesobesity  Renal/GU negative Renal ROS  negative genitourinary   Musculoskeletal negative musculoskeletal ROS (+)   Abdominal   Peds negative pediatric ROS (+)  Hematology negative hematology ROS (+)   Anesthesia Other Findings   Reproductive/Obstetrics (+) Pregnancy                             Anesthesia Physical Anesthesia Plan  ASA: II  Anesthesia Plan: Spinal   Post-op Pain Management:    Induction:   Airway Management Planned:   Additional Equipment:   Intra-op Plan:   Post-operative Plan:   Informed Consent: I have reviewed the patients History and Physical, chart, labs and discussed the procedure including the risks, benefits and alternatives for the proposed anesthesia with the patient or authorized representative who has indicated his/her understanding and acceptance.   Dental advisory given  Plan Discussed with: CRNA  Anesthesia Plan Comments:         Anesthesia Quick Evaluation

## 2015-05-23 NOTE — H&P (Signed)
Obstetric Preoperative History and Physical  Beverly Mayer is a 35 y.o. G3P1101 with IUP at [redacted]w[redacted]d presenting for scheduled repeat cesarean section and bilateral tubal sterilization.  No acute concerns.   Prenatal Course Pregnancy complications or risks: Patient Active Problem List   Diagnosis Date Noted  . Previous cesarean section complicating pregnancy, antepartum condition or complication 04/13/2015  . NSAID in pregnancy, third trimester 04/13/2015  . Supervision of high risk pregnancy, antepartum 03/23/2015  . Gestational diabetes 03/23/2015   She plans to breastfeed She desires bilateral tubal ligation for postpartum contraception.   Prenatal labs and studies: ABO, Rh: --/--/O POS, O POS (01/26 1318) Antibody: NEG (01/26 1318) Rubella: Immune (08/11 0000) RPR: Non Reactive (01/26 1318)  HBsAg: Negative (08/11 0000)  HIV: Non-reactive (08/11 0000)  GBS:  Genetic screening normal Anatomy US normal  Past Medical History  Diagnosis Date  . Abnormal Pap smear of cervix     LGSIL - cryo - 2008  . Gestational diabetes     Past Surgical History  Procedure Laterality Date  . Cesarean section    . Dilation and curettage of uterus      OB History  Gravida Para Term Preterm AB SAB TAB Ectopic Multiple Living  3 2 1 1 0 0 0 0 0 1    # Outcome Date GA Lbr Len/2nd Weight Sex Delivery Anes PTL Lv  3 Current           2 Term 05/09/06 [redacted]w[redacted]d  8 lb (3.629 kg) M CS-LTranv   Y  1 Preterm 2006 [redacted]w[redacted]d            Comments: was told baby had died at 12 wks. had D&C.      Social History   Social History  . Marital Status: Single    Spouse Name: N/A  . Number of Children: N/A  . Years of Education: N/A   Social History Main Topics  . Smoking status: Never Smoker   . Smokeless tobacco: Never Used  . Alcohol Use: No  . Drug Use: No  . Sexual Activity: Yes    Birth Control/ Protection: None   Other Topics Concern  . None   Social History Narrative    Family History   Problem Relation Age of Onset  . Heart disease Father     Prescriptions prior to admission  Medication Sig Dispense Refill Last Dose  . ACCU-CHEK FASTCLIX LANCETS MISC Inject 1 each into the skin 4 (four) times daily. O24.419  for testing 4 times daily GDM 102 each 12 Taking  . acetaminophen (TYLENOL) 325 MG tablet Take 650 mg by mouth every 6 (six) hours as needed for mild pain or moderate pain.    Taking  . glucose blood (ACCU-CHEK SMARTVIEW) test strip DX Gestational Diabetes O24.419  for testing 4 times daily 100 each 12 Taking  . Multiple Vitamin (MULTIVITAMINS PO) Take 1 tablet by mouth daily.   Taking    No Known Allergies  Review of Systems: Negative except for what is mentioned in HPI.  Physical Exam: BP 113/78 mmHg  Pulse 93  Temp(Src) 97.7 F (36.5 C) (Oral)  Ht 4' 11" (1.499 m)  Wt 149 lb (67.586 kg)  BMI 30.08 kg/m2  LMP 08/23/2014 (Exact Date) FHR by Doppler: 150 bpm CONSTITUTIONAL: Well-developed, well-nourished female in no acute distress.  HENT:  Normocephalic, atraumatic, External right and left ear normal. Oropharynx is clear and moist EYES: Conjunctivae and EOM are normal. Pupils are equal, round, and   reactive to light. No scleral icterus.  NECK: Normal range of motion, supple, no masses SKIN: Skin is warm and dry. No rash noted. Not diaphoretic. No erythema. No pallor. NEUROLGIC: Alert and oriented to person, place, and time. Normal reflexes, muscle tone coordination. No cranial nerve deficit noted. PSYCHIATRIC: Normal mood and affect. Normal behavior. Normal judgment and thought content. CARDIOVASCULAR: Normal heart rate noted, regular rhythm RESPIRATORY: Effort and breath sounds normal, no problems with respiration noted ABDOMEN: Soft, nontender, nondistended, gravid. Well-healed Pfannenstiel incision. PELVIC: Deferred MUSCULOSKELETAL: Normal range of motion. No edema and no tenderness. 2+ distal pulses.   Pertinent Labs/Studies:   Results for orders  placed or performed during the hospital encounter of 05/21/15 (from the past 72 hour(s))  CBC     Status: Abnormal   Collection Time: 05/21/15  1:18 PM  Result Value Ref Range   WBC 6.7 4.0 - 10.5 K/uL   RBC 4.20 3.87 - 5.11 MIL/uL   Hemoglobin 10.5 (L) 12.0 - 15.0 g/dL   HCT 33.4 (L) 36.0 - 46.0 %   MCV 79.5 78.0 - 100.0 fL   MCH 25.0 (L) 26.0 - 34.0 pg   MCHC 31.4 30.0 - 36.0 g/dL   RDW 15.8 (H) 11.5 - 15.5 %   Platelets 229 150 - 400 K/uL  RPR     Status: None   Collection Time: 05/21/15  1:18 PM  Result Value Ref Range   RPR Ser Ql Non Reactive Non Reactive    Comment: (NOTE) Performed At: [facility] [address] , [place] [id number] [place] William F MD Ph:[phone]   Type and screen     Status: None   Collection Time: 05/21/15  1:18 PM  Result Value Ref Range   ABO/RH(D) O POS    Antibody Screen NEG    Sample Expiration 05/24/2015   Basic metabolic panel     Status: Abnormal   Collection Time: 05/21/15  1:18 PM  Result Value Ref Range   Sodium 136 135 - 145 mmol/L   Potassium 4.0 3.5 - 5.1 mmol/L   Chloride 106 101 - 111 mmol/L   CO2 21 (L) 22 - 32 mmol/L   Glucose, Bld 93 65 - 99 mg/dL   BUN 9 6 - 20 mg/dL   Creatinine, Ser 0.57 0.44 - 1.00 mg/dL   Calcium 8.9 8.9 - 10.3 mg/dL   GFR calc non Af Amer >60 >60 mL/min   GFR calc Af Amer >60 >60 mL/min    Comment: (NOTE) The eGFR has been calculated using the CKD EPI equation. This calculation has not been validated in all clinical situations. eGFR's persistently <60 mL/min signify possible Chronic Kidney Disease.    Anion gap 9 5 - 15  ABO/Rh     Status: None   Collection Time: 05/21/15  1:18 PM  Result Value Ref Range   ABO/RH(D) O POS     Assessment and Plan :Bryttany L Moorehead is a 35 y.o. G3P1101 at [redacted]w[redacted]d being admitted for scheduled repeat cesarean section and bilateral tubal sterilization using Filshie clips.  The risks of cesarean section were discussed with the patient including but  were not limited to: bleeding which may require transfusion or reoperation; infection which may require antibiotics; injury to bowel, bladder, ureters or other surrounding organs; injury to the fetus; need for additional procedures including hysterectomy in the event of a life-threatening hemorrhage; placental abnormalities wth subsequent pregnancies, incisional problems, thromboembolic phenomenon and other postoperative/anesthesia complications.  Patient also desires permanent sterilization.    Other reversible forms of contraception were discussed with patient; she declines all other modalities. Risks of procedure discussed with patient including but not limited to: risk of regret, permanence of method, bleeding, infection, injury to surrounding organs and need for additional procedures.  Failure risk of 1-2% with increased risk of ectopic gestation if pregnancy occurs was also discussed with patient.  The patient concurred with the proposed plan, giving informed written consent for the procedures.  Patient has been NPO since last night  she will remain NPO for procedure. Anesthesia and OR aware.  Preoperative prophylactic antibiotics and SCDs ordered on call to the OR.  To OR when ready.   UGONNA ANYANWU, MD, FACOG  Attending Obstetrician & Gynecologist  Faculty Practice, [facility]  

## 2015-05-23 NOTE — Anesthesia Procedure Notes (Signed)
Spinal Patient location during procedure: OR Staffing Anesthesiologist: Maurico Perrell Performed by: anesthesiologist  Preanesthetic Checklist Completed: patient identified, site marked, surgical consent, pre-op evaluation, timeout performed, IV checked, risks and benefits discussed and monitors and equipment checked Spinal Block Patient position: sitting Prep: DuraPrep Patient monitoring: continuous pulse ox, blood pressure and heart rate Approach: midline Location: L3-4 Injection technique: single-shot Needle Needle type: Pencan  Needle gauge: 24 G Needle length: 9 cm Additional Notes Functioning IV was confirmed and monitors were applied. Sterile prep and drape, including hand hygiene, mask and sterile gloves were used. The patient was positioned and the spine was prepped. The skin was anesthetized with lidocaine.  Free flow of clear CSF was obtained prior to injecting local anesthetic into the CSF.  The spinal needle aspirated freely following injection.  The needle was carefully withdrawn.  The patient tolerated the procedure well. Consent was obtained prior to procedure with all questions answered and concerns addressed. Risks including but not limited to bleeding, infection, nerve damage, paralysis, failed block, inadequate analgesia, allergic reaction, high spinal, itching and headache were discussed and the patient wished to proceed.   Chrisa Hassan, MD     

## 2015-05-24 LAB — CBC
HEMATOCRIT: 25.2 % — AB (ref 36.0–46.0)
HEMATOCRIT: 26.9 % — AB (ref 36.0–46.0)
HEMOGLOBIN: 7.9 g/dL — AB (ref 12.0–15.0)
HEMOGLOBIN: 8.3 g/dL — AB (ref 12.0–15.0)
MCH: 25.1 pg — ABNORMAL LOW (ref 26.0–34.0)
MCH: 24.8 pg — ABNORMAL LOW (ref 26.0–34.0)
MCHC: 31.3 g/dL (ref 30.0–36.0)
MCHC: 30.9 g/dL (ref 30.0–36.0)
MCV: 80 fL (ref 78.0–100.0)
MCV: 80.3 fL (ref 78.0–100.0)
PLATELETS: 208 10*3/uL (ref 150–400)
Platelets: 215 10*3/uL (ref 150–400)
RBC: 3.15 MIL/uL — AB (ref 3.87–5.11)
RBC: 3.35 MIL/uL — AB (ref 3.87–5.11)
RDW: 15.9 % — ABNORMAL HIGH (ref 11.5–15.5)
RDW: 16 % — AB (ref 11.5–15.5)
WBC: 8.8 10*3/uL (ref 4.0–10.5)
WBC: 8.8 10*3/uL (ref 4.0–10.5)

## 2015-05-24 LAB — GLUCOSE, CAPILLARY: Glucose-Capillary: 93 mg/dL (ref 65–99)

## 2015-05-24 MED ORDER — FERROUS SULFATE 325 (65 FE) MG PO TABS
325.0000 mg | ORAL_TABLET | Freq: Every day | ORAL | Status: DC
  Administered 2015-05-24 – 2015-05-25 (×2): 325 mg via ORAL

## 2015-05-24 NOTE — Progress Notes (Signed)
POSTPARTUM PROGRESS NOTE   Subjective:  Beverly Mayer is a 35 y.o. [id number] [redacted]w[redacted]d s/p rltcs/btl.  No acute events overnight.  Pt denies problems with ambulating, voiding or po intake.  She denies nausea or vomiting.  Pain is well controlled.  She has had flatus. She has not had bowel movement.  Lochia Small.   Objective: Blood pressure 101/52, pulse 92, temperature 99.3 F (37.4 C), temperature source Axillary, resp. rate 18, height  (1.499 m), weight 149 lb (67.586 kg), last menstrual period 08/23/2014, SpO2 95 %, unknown if currently breastfeeding.  Physical Exam:  General: alert, cooperative and no distress Lochia:normal flow Chest: CTAB Heart: RRR no m/r/g Abdomen: +BS, soft, nontender,  Uterine Fundus: firm,  DVT Evaluation: No calf swelling or tenderness Extremities: trace edema   Recent Labs  05/21/15 1318 05/24/15 0538  HGB 10.5* 7.9*  HCT 33.4* 25.2*    Assessment/Plan:  ASSESSMENT: Beverly Mayer is a 35 y.o. [id number] [redacted]w[redacted]d s/p rltcs/btl yesterday. Doing well. H 7.9 from 10.5, will start oral iron. Fasting glucose is 93.  Plan for discharge tomorrow   LOS: 1 day   Silvano Bilis 05/24/2015, 6:40 AM

## 2015-05-24 NOTE — Lactation Note (Addendum)
This note was copied from the chart of Beverly Mayer. Lactation Consultation Note  Mother states she had difficulty breastfeeding first child so she pumped for 6 weeks. Baby recently breastfed for 20 min.  Mother states she knows how to hand express and baby is breastfeeding well. Reviewed cluster feeding.  Mom encouraged to feed baby 8-12 times/24 hours and with feeding cues.  Encouraged depth to avoid sore nipples. Reviewed engorgement care and monitoring voids/stools. Mom made aware of our phone # for post-discharge questions.     Patient Name: Beverly Mayer Date: 05/24/2015 Reason for consult: Initial assessment   Maternal Data Has patient been taught Hand Expression?: Yes Does the patient have breastfeeding experience prior to this delivery?: Yes  Feeding Length of feed: 10 min  LATCH Score/Interventions Latch: Grasps breast easily, tongue down, lips flanged, rhythmical sucking.  Audible Swallowing: Spontaneous and intermittent  Type of Nipple: Everted at rest and after stimulation  Comfort (Breast/Nipple): Soft / non-tender     Hold (Positioning): Assistance needed to correctly position infant at breast and maintain latch.  LATCH Score: 9  Lactation Tools Discussed/Used     Consult Status Consult Status: Follow-up Date: 05/18/15 Follow-up type: In-patient    Dahlia Byes [facility] 05/24/2015, 1:28 PM

## 2015-05-24 NOTE — Progress Notes (Signed)
Pt now tolerating solid foods well.

## 2015-05-25 ENCOUNTER — Encounter (HOSPITAL_COMMUNITY): Payer: Self-pay | Admitting: Obstetrics & Gynecology

## 2015-05-25 LAB — GLUCOSE, CAPILLARY: Glucose-Capillary: 67 mg/dL (ref 65–99)

## 2015-05-25 NOTE — Progress Notes (Signed)
  Subjective: Post Partum Day 2 Beverly Mayer is a 35 y.o. [id number] [redacted]w[redacted]d s/p rltcs/btl  No changes overnight. Patient denies problems with ambulating, voiding or PO intake. Small Lochia Pain at incision site improving. plans to breastfeed, bilateral tubal ligation  Objective: Blood pressure 124/86, pulse 93, temperature 98.1 F (36.7 C), temperature source Oral, resp. rate 18, height  (1.499 m), weight 67.586 kg (149 lb), last menstrual period 08/23/2014, SpO2 99 %, unknown if currently breastfeeding.  Physical Exam:  General: awake breastfeeding. alert, cooperative and no distress Lochia:normal flow Chest: CTAB Heart: RRR no m/r/g Abdomen: +BS, soft, tender near incision site. Incision c/d/i Uterine Fundus: firm and low DVT Evaluation: No evidence of DVT seen on physical exam. Extremities: trace edema   Recent Labs  05/24/15 0538 05/24/15 1141  HGB 7.9* 8.3*  HCT 25.2* 26.9*    Assessment/Plan:  Beverly Mayer is a 35 y.o. [id number] [redacted]w[redacted]d s/p rltcs/btl POD #2.  H 8.3 from 7.8, will started iron yesterday.   Plan for discharge tomorrow and Breastfeeding, outpatient circ   LOS: 2 days   Everrett Coombe 05/25/2015, 34:22 AM

## 2015-05-25 NOTE — Lactation Note (Addendum)
This note was copied from the chart of [name]. Lactation Consultation Note  Patient Name: [name] Date: 05/25/2015 Reason for consult: Follow-up assessment;Infant weight loss  Follow up with 53 hour old infant at RN Request. Infant with 12 BF for 10-45 min, 5 voids and 1 stool in last 24 hours. Infant with 8% weigh loss since birth. LATCH Score 7 by bedside RN. Mom reports her breasts feel fuller today. Able to hand express large gtts colostrum/transitional milk from both breasts left > right breast. Nurses had mom use manual pump before I arrived and obtained 5 cc EBM that was spoon fed to infant. Infant was in football hold to left breast when I went into the room, he not latched deeply when I went into room, Infant was flanged and rhythmically sucking with intermittent swallows. Assisted mom in pulling infant closer with feeding. When infant came off breast, nipple was compressed, mom denies pain with feeding. Assisted mom in positioning infant for deeper latch in football and cross cradle hold. Infant tongue noted to be able to be extended over gum line when sucking on gloved finger and when licking lips. Infant noted to have 2 short bursts of frequent swallows with feeding and intermittent swallows otherwise. Mom with soft compressible breasts and everted nipples. Mom is very tired and without support. Due to breast filling breasts, infant voids, and infant swallows, will continue with current BF 8-12 x in 24 hours at first feeding cues. Mom is aware of only 1 stool in last 24 hours. Advised mom to pump with manual pump every 2-3 hours followed by hand expression and all EBM to be given to infant. Advised mom and Bedside RN that if infant weight decreases tonight at midnight to begin EBM/formula supplementation every 3 hours after BF. May need to add DEBP if formula supplementation begun. Mom agreeable to adding formula supplementation as needed. Discussed with Bedside  RN's Patty and Juliette Alcide. Will follow up tomorrow and prn.  Maternal Data Formula Feeding for Exclusion: No Has patient been taught Hand Expression?: Yes Does the patient have breastfeeding experience prior to this delivery?: Yes  Feeding Feeding Type: Breast Fed Length of feed: 30 min  LATCH Score/Interventions Latch: Grasps breast easily, tongue down, lips flanged, rhythmical sucking. Intervention(s): Adjust position;Assist with latch;Breast massage;Breast compression  Audible Swallowing: A few with stimulation Intervention(s): Hand expression;Alternate breast massage  Type of Nipple: Everted at rest and after stimulation  Comfort (Breast/Nipple): Soft / non-tender     Hold (Positioning): Assistance needed to correctly position infant at breast and maintain latch. Intervention(s): Breastfeeding basics reviewed;Support Pillows;Position options;Skin to skin  LATCH Score: 8  Lactation Tools Discussed/Used Pump Review: Setup, frequency, and cleaning;Milk Storage   Consult Status Consult Status: Follow-up Date: 05/26/15 Follow-up type: In-patient    Silas Flood Ahmon Tosi 05/25/2015, 5:04 PM

## 2015-05-25 NOTE — Clinical Social Work Maternal (Addendum)
CLINICAL SOCIAL WORK MATERNAL/CHILD NOTE  Patient Details  Name: Beverly Mayer MRN: [id number] Date of Birth: 1981-04-13  Date:  05/25/2015  Clinical Social Worker Initiating Note:  Loleta Books MSW, LCSW Date/ Time Initiated:  05/25/15/0945     Child's Name:  Durenda Age    Legal Guardian:  Mother   Need for Interpreter:  None   Date of Referral:  05/25/15     Reason for Referral:  Limited support  Referral Source:  Physician   Address:     [address]  [place] [address]     Phone number:        [phone]     Household Members:  Minor Children   Natural Supports (not living in the home):  Church, Co-workers  Professional Supports: None   Employment: Full-time   Type of Work: Warehouse manager work   Education:      Surveyor, quantity Resources:  [employer], Media planner   Other Resources:      Cultural/Religious Considerations Which May Impact Care:  MOB reported participation and involvement in her church community.   Strengths:  Ability to meet basic needs , Pediatrician chosen , Home prepared for child    Risk Factors/Current Problems:   1. Limited support  Cognitive State:  Able to Concentrate , Alert , Goal Oriented , Linear Thinking    Mood/Affect:  Tearful    CSW Assessment: CSW received request for consult due to concerns of MOB presenting with limited help and support as she prepares to transition home.  MOB presented as receptive to the visit as evidence by being easily engaged with CSW.  MOB was observed to be holding the infant and tearful during the entire assessment.   Per MOB, she lives alone, and reported that she has one other son who is 16 years old. MOB stated that she is originally from [place], and moved to [place] in 2005 after she left the [employer] (was based in [place]).  She shared that she moved with the father of her first child (her ex-husband).  Per MOB, all of her family lives in [place], and discussed how she has  created a natural support system in [place].   MOB reported that she attends church, is involved in the church community, and has also become friends with her co-workers.  She stated that her ex-husband has custody 2 nights of the week with her 50 year old son.  Per MOB, she frequently feels "stuck" and "trapped" in [place] due to her son's father living in [place]. She reported that she would be concerned about the cost of relocating to be near her family, including concerned about her ability to receive a job that could pay more due to increased cost of living in [place]. She shared that she would also be concerned about moving with her son since that would limit his access to his father, and reported that his father has also been minimally receptive to creating a custody agreement that would be amendable to her moving.  Due to these reasons, she has decided to remain in [place].  MOB became tearful as she discussed her current living situation. She shared that it has been difficult for her since she is reminded that she feels alone and isolated.  MOB discussed how she was primarily a single parent with her first child, and is not looking forward to the prospect of raising this infant alone.  MOB shared that she knows that she is not alone since she  has created a natural support system, and she smiled as she recognized her own resilience and ability to raise her first son on her own.  MOB also recognized that perseverance has assisted her to take it one day at a time. She reflected upon the high value she places on motherhood, and recognized that her children assist her to remain motivated.  CSW continued to explore with MOB how to build upon previous strengths to increase her sense of self-efficacy.   MOB shared belief that activity scheduling will assist her to feel more prepared to transition postpartum. MOB stated that she intends to attend weekly church services, and to continue to participate in a monthly church group.  She  expressed interest in trying to increase additional community involvement at least one time per week in order to continue to feel connected with others and to create a change in her daily routine/schedule.  MOB discussed at length how she finds this fulfilling, and a close replication to the support she misses from her family in [place].   During the pregnancy, MOB reported increase in depressive symptoms, but only identified increase in crying and sadness. She also endorsed sometimes feeling anxious and helpless about her ability to "do it".  MOB stated that she feels better since her co-workers assisted her to obtain infant supplies and have vocalized interest and desire to support her postpartum.  MOB denied postpartum depression after her first child, but did report remote history of anxiety.  MOB acknowledged her increased risk factors for perinatal mood disorders, and agreed to follow up with medical providers if she notes mental health concerns.   CSW Plan/Description:   1. Patient/Family Education-- perinatal mood and anxiety disorders 2. Information/Referral to [facility]-- community supports for new mothers, support groups 3. No Further Intervention Required/No Barriers to Discharge    Kelby Fam 05/25/2015, 2:42 PM

## 2015-05-26 MED ORDER — FERROUS SULFATE 325 (65 FE) MG PO TABS: 325.0000 mg | ORAL_TABLET | Freq: Two times a day (BID) | ORAL | Status: AC

## 2015-05-26 MED ORDER — IBUPROFEN 600 MG PO TABS: 600.0000 mg | ORAL_TABLET | Freq: Four times a day (QID) | ORAL | Status: AC

## 2015-05-26 MED ORDER — OXYCODONE-ACETAMINOPHEN 5-325 MG PO TABS: 1.0000 | ORAL_TABLET | ORAL | Status: AC | PRN

## 2015-05-26 MED ORDER — SENNOSIDES-DOCUSATE SODIUM 8.6-50 MG PO TABS: 2.0000 | ORAL_TABLET | Freq: Every evening | ORAL | Status: AC | PRN

## 2015-05-26 NOTE — Lactation Note (Signed)
This note was copied from the chart of Boy Demetrius Barrell. Lactation Consultation Note  Patient Name: Boy Huberta Tompkins ZOXWR'U Date: 05/26/2015  baby is 33 hours old with 11 % weight loss. Per Pedis MD , per [facility] RN mom needs to supplement after each feeding with EBM or formula. Per mom wants to supplement with a bottle not the syringe or SNS.  Baby latched when LC walked into the room. Depth noted with latch, multiply swallows, increased with breast compressions. Mom noted to be bend over to baby and lacking pillow support. LC reviewed basics of latching,, importance of firm support and getting belly to breast.  Mom reports comfort on the left breast. Baby released and large wet diaper changed by LC , and LC assisted with latch for depth on the right breast. @ 1st mom felt some discomfort , and once the baby's chin eased down more comfortable . Multiply swallows noted, increased with breast compressions.  LC recommended due to her breast feeling warmer , fuller , and multiply swallows, to offer both breast every feeding and then supplement up to 25 ml. And post pump both breast .  Per mom the #24 [address] is comfortable with pumping.  Per mom probably when she goes home will plan to just pump and bottle feed. LC reviewed potential options Breast and bottle/ and pumping. Mom receptive.,   Maternal Data    Feeding Feeding Type: Breast Fed Length of feed: 15 min  LATCH Score/Interventions                      Lactation Tools Discussed/Used     Consult Status      Kathrin Greathouse 05/26/2015, 11:47 AM

## 2015-05-26 NOTE — Discharge Summary (Signed)
OB Discharge Summary     Patient Name: Beverly Mayer DOB: January 29, 1981 MRN: [id number]  Date of admission: 05/23/2015 Delivering MD: Jaynie Collins A   Date of discharge: 05/26/2015  Admitting diagnosis: cpt [phone] and [id number]  REPEAT csection and undesired fertility Intrauterine pregnancy: [redacted]w[redacted]d     Secondary diagnosis:  Principal Problem:   S/P repeat cesarean section and BTS on 05/22/14 Active Problems:   Supervision of high risk pregnancy, antepartum   Gestational diabetes  Additional problems:  NSAID in pregnancy, third trimester    Discharge diagnosis: Term Pregnancy Delivered                                                                                                Post partum procedures:postpartum tubal ligation  Augmentation: n/a  Complications: None  Hospital course:  Sceduled C/S   35 y.o. yo G3P2102 at [redacted]w[redacted]d was admitted to the hospital 05/23/2015 for scheduled cesarean section with the following indication:Elective Repeat.  Membrane Rupture Time/Date: 10:46 AM ,05/23/2015   Patient delivered a Viable infant.05/23/2015  Details of operation can be found in separate operative note.  Post-operatively Hgb dropped from 10.5 > 7.9, started iron PO then repeat hgb 8.3, did not require transfusion. Otherwise pateint had an uncomplicated postpartum course.  She is ambulating, tolerating a regular diet, passing flatus, and urinating well. Patient is discharged home in stable condition on  05/26/2015          Physical exam  Filed Vitals:   05/24/15 1746 05/25/15 0500 05/25/15 1904 05/26/15 0603  BP: 122/68 124/86 126/74 118/73  Pulse: 83 93 77 78  Temp: 98.1 F (36.7 C) 98.1 F (36.7 C) 98.3 F (36.8 C) 97.8 F (36.6 C)  TempSrc: Oral Oral Oral Oral  Resp: Height:      Weight:      SpO2:  99%     General: alert, cooperative and no distress Lochia: appropriate Uterine Fundus: firm Incision: Healing well with no significant drainage, No  significant erythema, Dressing is clean, dry, and intact DVT Evaluation: No evidence of DVT seen on physical exam. Labs: Lab Results  Component Value Date   WBC 8.8 05/24/2015   HGB 8.3* 05/24/2015   HCT 26.9* 05/24/2015   MCV 80.3 05/24/2015   PLT 208 05/24/2015   CMP Latest Ref Rng 05/21/2015  Glucose 65 - 99 mg/dL 93  BUN 6 - 20 mg/dL 9  Creatinine 8.29 - 5.62 mg/dL 1.30  Sodium 865 - 784 mmol/L 136  Potassium 3.5 - 5.1 mmol/L 4.0  Chloride 101 - 111 mmol/L 106  CO2 22 - 32 mmol/L 21(L)  Calcium 8.9 - 10.3 mg/dL 8.9    Discharge instruction: per After Visit Summary and "Baby and Me Booklet".  After visit meds:    Medication List    STOP taking these medications        ACCU-CHEK FASTCLIX LANCETS Misc     glucose blood test strip  Commonly known as:  ACCU-CHEK SMARTVIEW      TAKE these medications        acetaminophen  325 MG tablet  Commonly known as:  TYLENOL  Take 650 mg by mouth every 6 (six) hours as needed for mild pain or moderate pain.     ferrous sulfate 325 (65 FE) MG tablet  Take 1 tablet (325 mg total) by mouth 2 (two) times daily with a meal.     ibuprofen 600 MG tablet  Commonly known as:  ADVIL,MOTRIN  Take 1 tablet (600 mg total) by mouth every 6 (six) hours.     MULTIVITAMINS PO  Take 1 tablet by mouth daily.     oxyCODONE-acetaminophen 5-325 MG tablet  Commonly known as:  PERCOCET/ROXICET  Take 1 tablet by mouth every 4 (four) hours as needed for severe pain.     senna-docusate 8.6-50 MG tablet  Commonly known as:  Senokot-S  Take 2 tablets by mouth at bedtime as needed for mild constipation.        Diet: carb modified diet  Activity: Advance as tolerated. Pelvic rest for 6 weeks.   Outpatient follow up:6 weeks Follow up Appt:Future Appointments Date Time Provider Department Center  06/29/2015 1:15 PM Willodean Rosenthal, MD [facility]   Follow up Visit: Follow-up Information    Follow up with [facility].  Schedule an appointment as soon as possible for a visit in 6 weeks.   Specialty:  Obstetrics and Gynecology   Why:  Postpartum appointment   Contact information:   [address] [place] [address] [phone]       Postpartum contraception: Tubal Ligation  Newborn Data: Live born female  Birth Weight: 7 lb 10.2 oz (3465 g) APGAR: 9, 9  Baby Feeding: Breast Disposition:home with mother   05/26/2015 Wynne Dust, MD   OB fellow attestation I have seen and examined this patient and agree with above documentation in the resident's note.   Beverly Mayer is a 35 y.o. O1H0865 s/p rLTCS with BTL.   Pain is well controlled.  Plan for birth control is bilateral tubal ligation.  Method of Feeding: breast   PE:  BP 118/73 mmHg  Pulse 78  Temp(Src) 97.8 F (36.6 C) (Oral)  Resp 18  Ht  (1.499 m)  Wt 149 lb (67.586 kg)  BMI 30.08 kg/m2  SpO2 99%  LMP 08/23/2014 (Exact Date)  Breastfeeding? Unknown Gen: well appearing Heart: reg rate Lungs: normal WOB Fundus firm Ext: soft, no pain, no edema   Recent Labs  05/24/15 0538 05/24/15 1141  HGB 7.9* 8.3*  HCT 25.2* 26.9*   Plan: discharge today - postpartum care discussed - f/u clinic in 6 weeks for postpartum visit - will need glucola in follow up fasting BG wnl - continue Fe for anemia.    Federico Flake, MD 9:41 AM

## 2015-05-26 NOTE — Progress Notes (Signed)
CSW completed follow up visit with MOB in order to provide her with a list of community resources for informal and formal support groups for mothers in [place].  MOB expressed appreciation for the information, and continues to present insight in regards to the importance of increasing interactions with others outside of the home. CSW also reviewed perinatal mood disorder resources in [place].   During the previous interaction with MOB, she reported that evenings can be "hard" since she anticipates it to be restless with a crying infant.  MOB shared that she did "okay" with the previous evening, and discussed how it was closely related to her milk coming in. She stated that the infant is crying more, but is then sleeping after feedings once he is content. MOB reported that she decided to supplement with formula, and feels content with the decision since it allows both her and the infant to sleep more.   CSW continued to assist the MOB to increase her sense of self-efficacy. MOB was receptive to exploring her greatest accomplishments during the pregnancy, and what other people have identified as her strengths. MOB was noted to be smiling and displaying a full range in affect as she reflected upon her strengths.   No barriers to discharge. Contact CSW if additional needs arise.

## 2015-05-26 NOTE — Discharge Instructions (Signed)

## 2015-05-27 ENCOUNTER — Telehealth (HOSPITAL_COMMUNITY): Payer: Self-pay | Admitting: *Deleted

## 2015-05-27 ENCOUNTER — Ambulatory Visit: Payer: Self-pay

## 2015-05-27 NOTE — Lactation Note (Signed)
This note was copied from the chart of Beverly Mayer. Lactation Consultation Note  Patient Name: Beverly Mayer NWGNF'A Date: 05/27/2015 Reason for consult: Follow-up assessment;Other (Comment) (increased weight up 3 oz )  Baby is 19 hour old, and was held over as a baby pt. Due to 11 % weight loss. Over night increased 3 oz and will be D/C today.  Baby has been breast feeding and supplementing with EBM and of formula. Baby did not feed at this consult due to recently being fed.  Per mom will have a DEBP at home and LC recommended since her milk is in to continue breast feeding , softening 1st breast well,  Offering the 2nd, decrease supplement down to 20 ml. And post pump. Follow this LC Plan until returning to [facility] O/P appt. With [employer] IBCLC  On This Friday. ( per mom ). LC reviewed basics, mom denies sore ness. Sore nipple and engorgement prevention and tx reviewed.  Mother informed of post-discharge support and given phone number to the lactation department, including services for phone call assistance; out-patient appointments; and breastfeeding support group. List of other breastfeeding resources in the community given in the handout. Encouraged mother to call for problems or concerns related to breastfeeding.  Maternal Data    Feeding baby fed prior to [facility] visit  Feeding Type: Breast Fed Length of feed: 25 min  LATCH Score/Interventions                Intervention(s): Breastfeeding basics reviewed     Lactation Tools Discussed/Used     Consult Status Consult Status: Complete Date: 05/27/15    Kathrin Greathouse 05/27/2015, 9:21 AM

## 2015-05-27 NOTE — Telephone Encounter (Signed)
Medicaid tubal consent form mailed to [facility] for payment 

## 2015-06-29 ENCOUNTER — Ambulatory Visit: Payer: Medicaid Other | Admitting: Obstetrics & Gynecology

## 2017-06-29 IMAGING — US US MFM OB COMP +14 WKS
1 series · 14 of 28 positions shown · non-contrast
Comparison: none

[Series 1: us mfm ob comp +14 wks · 71 acquisitions, 14 frames shown]
[im 3/71]
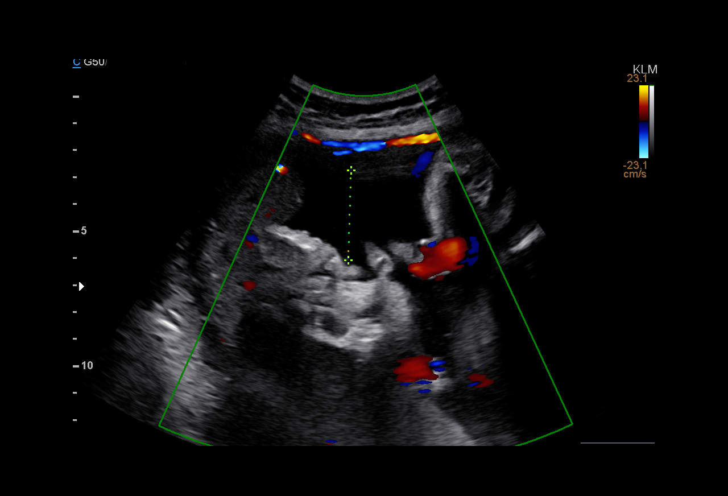
[im 8/71]
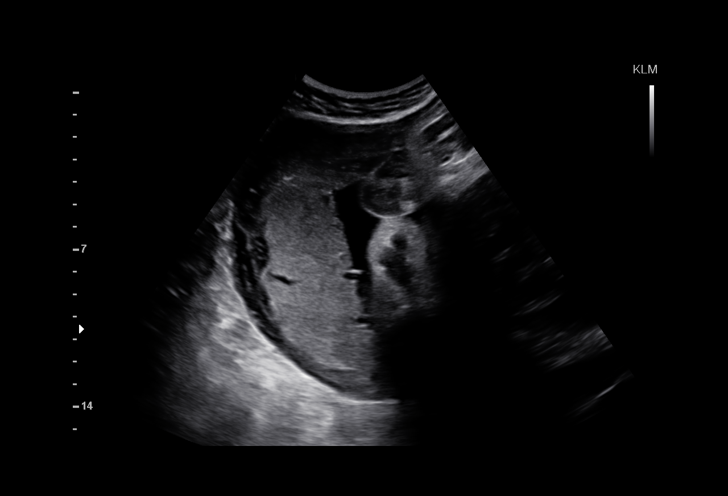
[im 13/71]
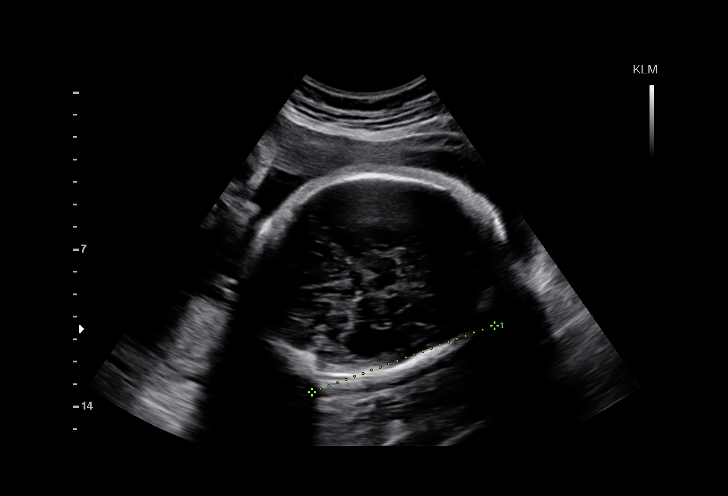
[im 19/71]
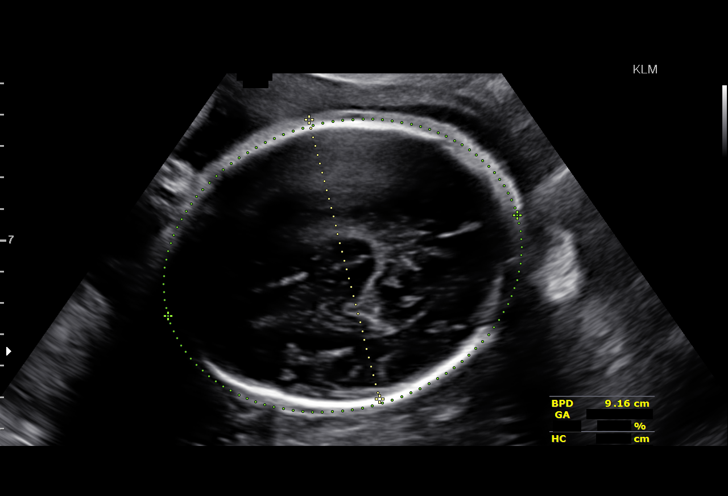
[im 24/71]
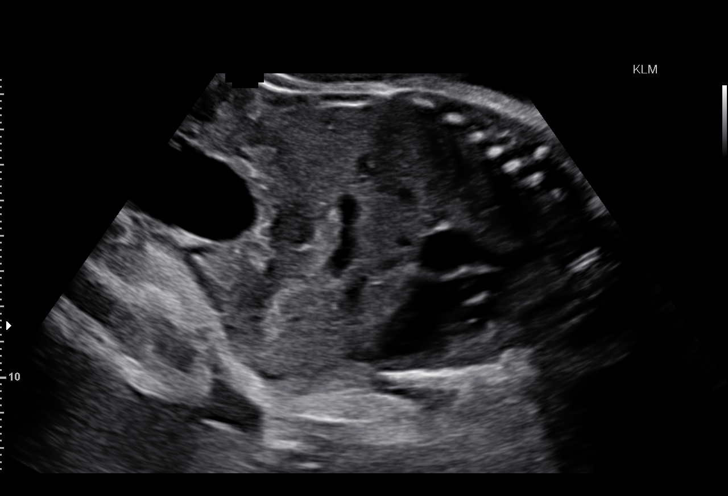
[im 29/71]
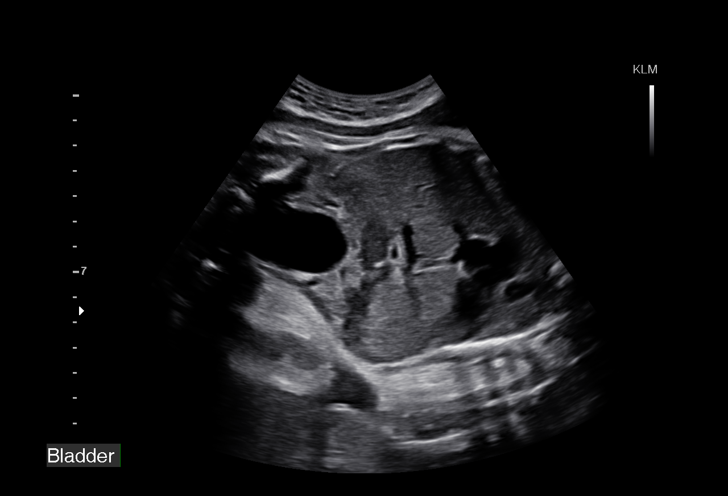
[im 34/71]
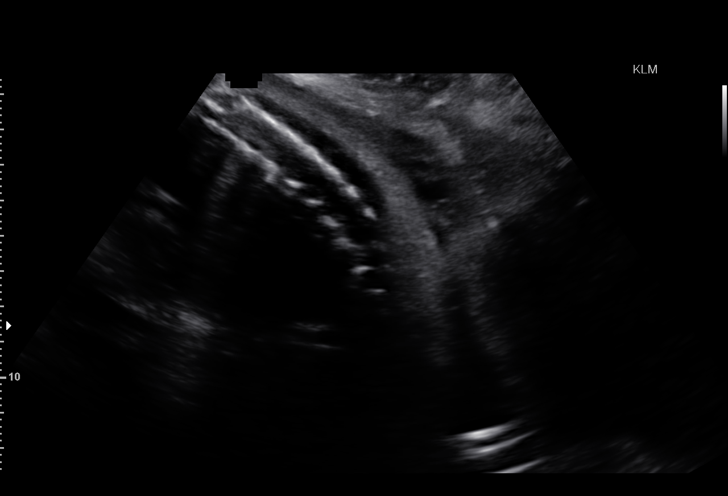
[im 39/71]
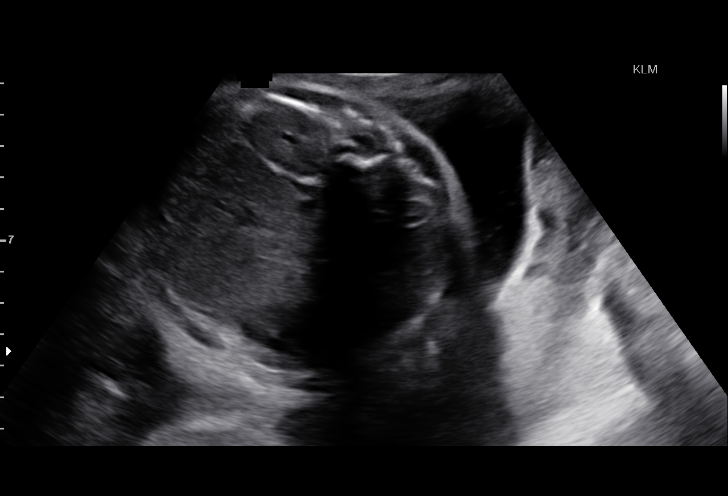
[im 45/71]
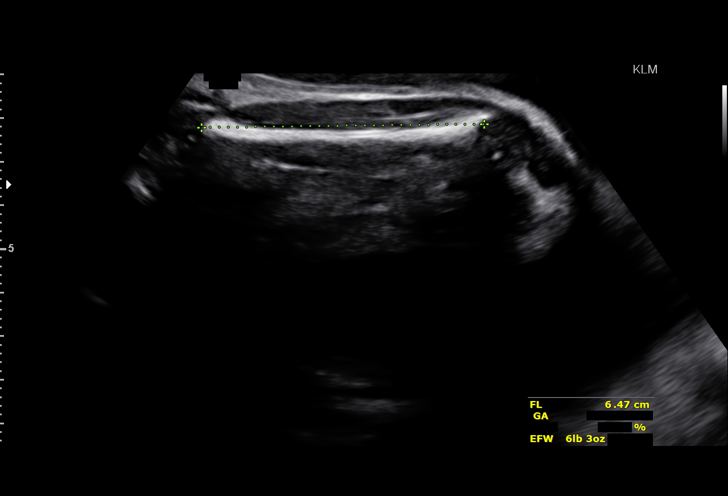
[im 50/71]
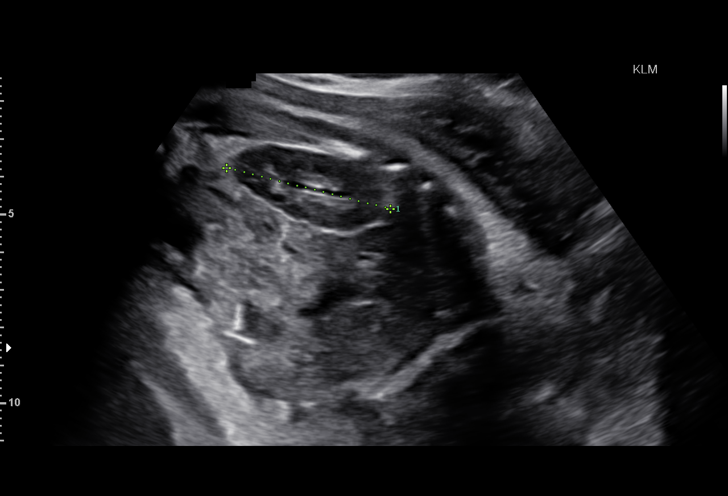
[im 55/71]
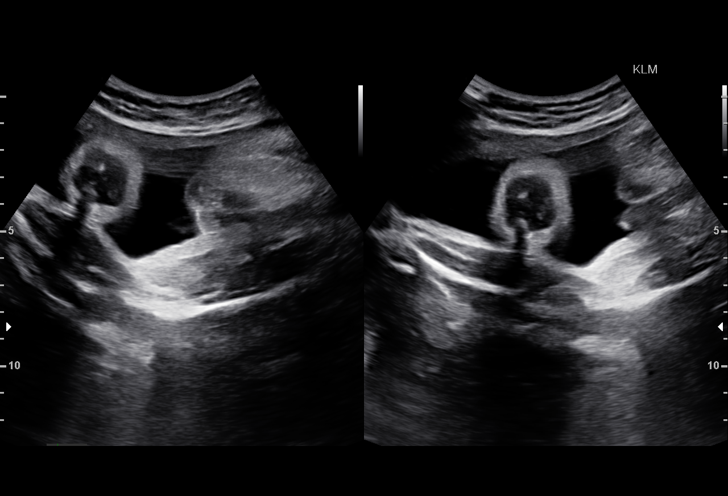
[im 60/71]
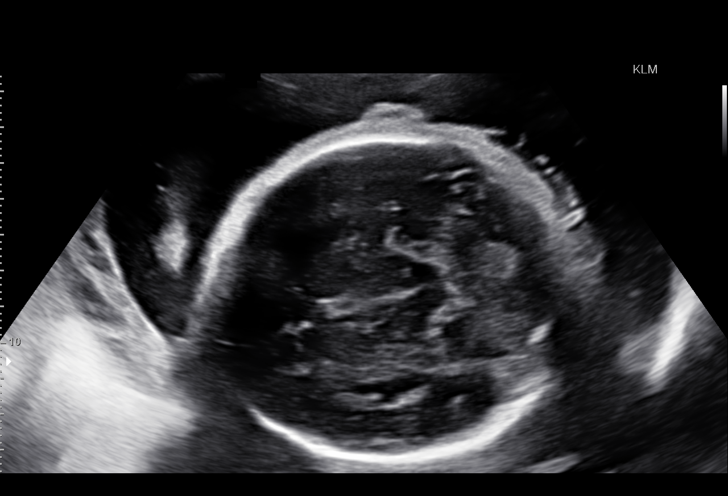
[im 65/71]
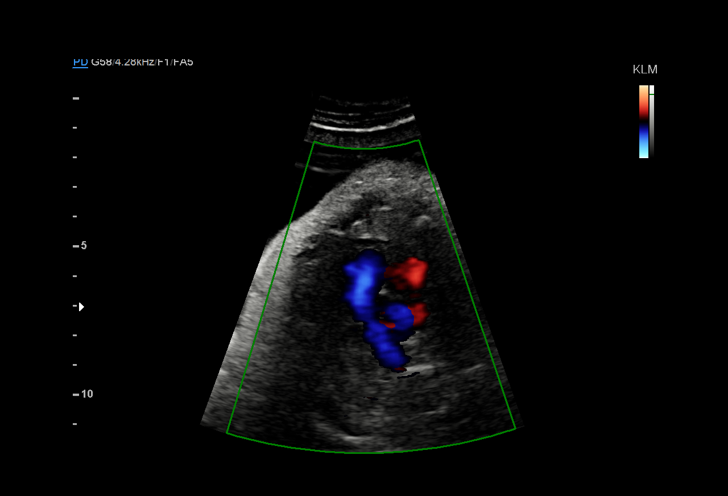
[im 71/71]
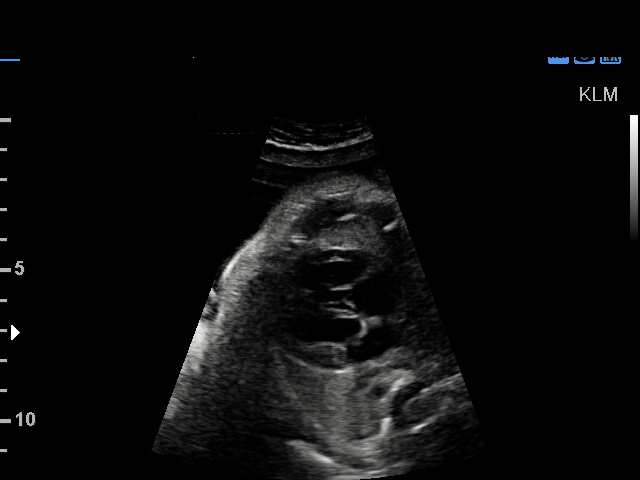

[14 of 28 positions shown; findings below may reference images not displayed]

pm)

Name:       NEMETH PALYOV                    Visit  04/23/2015 [DATE]
Date:

1  FEDE MCRAE          [id number]        [id number]     [id number]
Indications

Gestational diabetes in pregnancy, diet
controlled
Previous cesarean delivery, antepartum
34 weeks gestation of pregnancy
Detailed fetal anatomic survey                  Z36
OB History

Gravidity:     3         Term:  1        Prem:    0        SAB:   1
TOP:           0       Ectopic  0        Living:  1
:
Fetal Evaluation

Num Of Fetuses:      1
Fetal Heart          164
Rate(bpm):
Cardiac Activity:    Observed
Presentation:        Cephalic
Placenta:            Fundal, above cervical os
P. Cord Insertion:   Visualized

Amniotic Fluid
AFI FV:      Subjectively within normal limits
AFI Sum:     19.93    cm      74  %Tile     Larg Pckt:    8.14   cm
RUQ:   3.33    cm    RLQ:   4.77    cm   LUQ:    3.69    cm   LLQ:    8.14   cm
Biometry

BPD:      90.9  mm     G. Age:   36w 6d                  CI:        77.52   %    70 - 86
FL/HC:      19.9   %    20.1 -
HC:      326.8  mm     G. Age:   37w 1d        75   %    HC/AC:      1.00        0.93 -
AC:      326.1  mm     G. Age:   36w 4d        93   %    FL/BPD      71.6   %    71 - 87
:
FL:       65.1  mm     G. Age:   33w 4d        16   %    FL/AC:      20.0   %    20 - 24

Est.        3359   gm    6 lb 2 oz      80   %
FW:
Gestational Age

LMP:           34w 5d        Date:  08/23/14                  EDD:   05/30/15
U/S Today:     36w 0d                                         EDD:   05/21/15
Best:          34w 5d    Det. By:   LMP  (08/23/14)           EDD:   05/30/15
Anatomy

Cranium:          Appears normal         Aortic Arch:       Not well visualized
Fetal Cavum:      Appears normal         Ductal Arch:       Not well visualized
Ventricles:       Appears normal         Diaphragm:         Appears normal
Choroid Plexus:   Appears normal         Stomach:           Appears normal,
left sided
Cerebellum:       Appears normal         Abdomen:           Appears normal
Posterior         Appears normal         Abdominal          Not well visualized
Fossa:                                   Wall:
Nuchal Fold:      Not applicable (>20    Cord Vessels:      Appears normal (3
wks GA)                                   vessel cord)
Face:             Appears normal         Kidneys:           Appear normal
(orbits and profile)
Lips:             Appears normal         Bladder:           Appears normal
Fetal Thoracic:   Appears normal         Spine:             Appears normal
Heart:            Appears normal         Upper              Visualized
(4CH, axis, and        Extremities:
situs
RVOT:             Appears normal         Lower              Visualized
Extremities:
LVOT:             Appears normal

Other:   Fetus appears to be a male. Technically difficult due to fetal
position.
Cervix Uterus Adnexa

Cervix
Not visualized (advanced GA >39wks)
Impression

SIUP at 34+5 weeks
Normal detailed fetal anatomy; limited views of arches and
CI
Normal amniotic fluid volume
Measurements consistent with LMP dating; EFW at the
80th %tile
Recommendations

Follow-up as clinically indicated

## 2018-10-03 ENCOUNTER — Encounter: Payer: Self-pay | Admitting: *Deleted
